# Patient Record
Sex: Female | Born: 1980 | Race: White | Hispanic: No | Marital: Single | Smoking: Former smoker
Health system: Southern US, Community
[De-identification: ages and names within clinical notes are randomized; demographics above are authoritative.]

## PROBLEM LIST (undated history)

## (undated) DIAGNOSIS — G44229 Chronic tension-type headache, not intractable: Secondary | ICD-10-CM

## (undated) DIAGNOSIS — T7840XA Allergy, unspecified, initial encounter: Secondary | ICD-10-CM

## (undated) DIAGNOSIS — F419 Anxiety disorder, unspecified: Secondary | ICD-10-CM

## (undated) DIAGNOSIS — Z8659 Personal history of other mental and behavioral disorders: Secondary | ICD-10-CM

## (undated) DIAGNOSIS — F32A Depression, unspecified: Secondary | ICD-10-CM

## (undated) DIAGNOSIS — F329 Major depressive disorder, single episode, unspecified: Secondary | ICD-10-CM

## (undated) DIAGNOSIS — Q7030 Webbed toes, unspecified foot: Secondary | ICD-10-CM

## (undated) DIAGNOSIS — K219 Gastro-esophageal reflux disease without esophagitis: Secondary | ICD-10-CM

## (undated) HISTORY — DX: Depression, unspecified: F32.A

## (undated) HISTORY — DX: Allergy, unspecified, initial encounter: T78.40XA

## (undated) HISTORY — DX: Webbed toes, unspecified foot: Q70.30

## (undated) HISTORY — DX: Chronic tension-type headache, not intractable: G44.229

## (undated) HISTORY — DX: Anxiety disorder, unspecified: F41.9

## (undated) HISTORY — DX: Gastro-esophageal reflux disease without esophagitis: K21.9

## (undated) HISTORY — DX: Personal history of other mental and behavioral disorders: Z86.59

## (undated) HISTORY — PX: OTHER SURGICAL HISTORY: SHX169

## (undated) HISTORY — DX: Major depressive disorder, single episode, unspecified: F32.9

## (undated) HISTORY — PX: COSMETIC SURGERY: SHX468

---

## 2002-08-01 ENCOUNTER — Encounter: Payer: Self-pay | Admitting: General Surgery

## 2002-08-01 ENCOUNTER — Emergency Department (HOSPITAL_COMMUNITY): Admission: EM | Admit: 2002-08-01 | Discharge: 2002-08-02 | Payer: Self-pay | Admitting: Emergency Medicine

## 2011-12-10 ENCOUNTER — Ambulatory Visit (INDEPENDENT_AMBULATORY_CARE_PROVIDER_SITE_OTHER): Payer: Managed Care, Other (non HMO)

## 2011-12-10 DIAGNOSIS — J0110 Acute frontal sinusitis, unspecified: Secondary | ICD-10-CM

## 2011-12-10 DIAGNOSIS — H66009 Acute suppurative otitis media without spontaneous rupture of ear drum, unspecified ear: Secondary | ICD-10-CM

## 2012-01-03 ENCOUNTER — Ambulatory Visit (INDEPENDENT_AMBULATORY_CARE_PROVIDER_SITE_OTHER): Payer: Managed Care, Other (non HMO) | Admitting: Family Medicine

## 2012-01-03 VITALS — BP 124/80 | HR 83 | Temp 98.7°F | Resp 16 | Ht 66.25 in | Wt 203.6 lb

## 2012-01-03 DIAGNOSIS — J069 Acute upper respiratory infection, unspecified: Secondary | ICD-10-CM

## 2012-01-03 DIAGNOSIS — J029 Acute pharyngitis, unspecified: Secondary | ICD-10-CM

## 2012-01-03 DIAGNOSIS — R059 Cough, unspecified: Secondary | ICD-10-CM

## 2012-01-03 DIAGNOSIS — R05 Cough: Secondary | ICD-10-CM

## 2012-01-03 DIAGNOSIS — B9789 Other viral agents as the cause of diseases classified elsewhere: Secondary | ICD-10-CM

## 2012-01-03 LAB — POCT RAPID STREP A (OFFICE): Rapid Strep A Screen: NEGATIVE

## 2012-01-03 MED ORDER — BENZONATATE 100 MG PO CAPS: 100.0000 mg | ORAL_CAPSULE | Freq: Three times a day (TID) | ORAL | Status: AC | PRN

## 2012-01-03 NOTE — Progress Notes (Signed)
  Patient Name: Veronica Esparza Date of Birth: Jun 16, 1981 Medical Record Number: [id number] Gender: female Date of Encounter: 01/03/2012  History of Present Illness:  Veronica Esparza is a 31 y.o. very pleasant female patient who presents with the following:  She has noticed ST, cough, HA, nausea but no vomiting, slight clear rhinorrhea.  Has fun out of her zyrtec.  Symptoms started with cough 4 days ago, everything else followed.  Has noted chills and subjective fever, also body aches.  Has been laying in bed resting a lot.  Slight diarrhea which she thinks may have been used to what she ate.  Appetite is decreased, but she feel like eating greasy foods.  LMP = 2 weeks ago  There is no problem list on file for this patient.  Past Medical History  Diagnosis Date  . GERD (gastroesophageal reflux disease)   . Allergy   . Anxiety   . Depression   . Chronic tension headaches   . History of anorexia nervosa   . Webbing of toes    Past Surgical History  Procedure Date  . Oral surgery   . Hand webbing excision    History  Substance Use Topics  . Smoking status: Former Games developer  . Smokeless tobacco: Never Used  . Alcohol Use: Yes     socially   Family History  Problem Relation Age of Onset  . Cancer Father   . Hypertension Father    Allergies  Allergen Reactions  . Buspar (Buspirone Hcl)   . Codeine   . Sulfa Antibiotics     Medication list has been reviewed and updated.  Review of Systems: As per HPI- otherwise negative.   Physical Examination: Filed Vitals:   01/03/12 1352  BP: 124/80  Pulse: 83  Temp: 98.7 F (37.1 C)  TempSrc: Oral  Resp: 16  Height: 5' 6.25" (1.683 m)  Weight: 203 lb 9.6 oz (92.352 kg)    Body mass index is 32.61 kg/(m^2).  GEN: WDWN, NAD, Non-toxic, A & O x 3, overweight HEENT: Atraumatic, Normocephalic. Neck supple. No masses, No LAD. Ears and Nose: No external deformity. TM and oropharynx wnl CV: RRR, No M/G/R. No JVD. No thrill. No extra  heart sounds. PULM: CTA B, no wheezes, crackles, rhonchi. No retractions. No resp. distress. No accessory muscle use. EXTR: No c/c/e NEURO Normal gait.  PSYCH: Normally interactive. Conversant. Not depressed or anxious appearing.  Calm demeanor.   Results for orders placed in visit on 01/03/12  POCT RAPID STREP A (OFFICE)      Component Value Range   Rapid Strep A Screen Negative  Negative     Assessment and Plan: 1. Sore throat  POCT rapid strep A  2. Cough  benzonatate (TESSALON) 100 MG capsule  3. Viral URI     Likely viral infection.  Gave Rx for DMM also.  Gave zpack rx to hold- emphasized with her that this is likely a viral infection, but if she is not better within 2 or 3 days she can start the zpack if desired.  Let us know if she is not better within several days- Sooner if worse.

## 2012-01-15 ENCOUNTER — Ambulatory Visit: Payer: Managed Care, Other (non HMO)

## 2012-01-15 ENCOUNTER — Ambulatory Visit (INDEPENDENT_AMBULATORY_CARE_PROVIDER_SITE_OTHER): Payer: Managed Care, Other (non HMO) | Admitting: Family Medicine

## 2012-01-15 DIAGNOSIS — R062 Wheezing: Secondary | ICD-10-CM

## 2012-01-15 DIAGNOSIS — R059 Cough, unspecified: Secondary | ICD-10-CM

## 2012-01-15 DIAGNOSIS — K219 Gastro-esophageal reflux disease without esophagitis: Secondary | ICD-10-CM

## 2012-01-15 DIAGNOSIS — R05 Cough: Secondary | ICD-10-CM

## 2012-01-15 DIAGNOSIS — F418 Other specified anxiety disorders: Secondary | ICD-10-CM | POA: Insufficient documentation

## 2012-01-15 MED ORDER — PREDNISONE 20 MG PO TABS: ORAL_TABLET | ORAL | Status: DC

## 2012-01-15 MED ORDER — ALBUTEROL SULFATE HFA 108 (90 BASE) MCG/ACT IN AERS: 2.0000 | INHALATION_SPRAY | Freq: Four times a day (QID) | RESPIRATORY_TRACT | Status: DC | PRN

## 2012-01-15 NOTE — Patient Instructions (Signed)
Can increase tessalon to 3 times per day.  Proair up to every 6 hours as needed.  Drink plenty of fluids.  If not improved in 2 days, can fill prednisone. Return to the clinic or go to the nearest emergency room if any of your symptoms worsen or new symptoms occur.    Cough, Adult  A cough is a reflex that helps clear your throat and airways. It can help heal the body or may be a reaction to an irritated airway. A cough may only last 2 or 3 weeks (acute) or may last more than 8 weeks (chronic).  CAUSES Acute cough:  Viral or bacterial infections.  Chronic cough:  Infections.   Allergies.   Asthma.   Post-nasal drip.   Smoking.   Heartburn or acid reflux.   Some medicines.   Chronic lung problems (COPD).   Cancer.  SYMPTOMS   Cough.   Fever.   Chest pain.   Increased breathing rate.   High-pitched whistling sound when breathing (wheezing).   Colored mucus that you cough up (sputum).  TREATMENT   A bacterial cough may be treated with antibiotic medicine.   A viral cough must run its course and will not respond to antibiotics.   Your caregiver may recommend other treatments if you have a chronic cough.  HOME CARE INSTRUCTIONS   Only take over-the-counter or prescription medicines for pain, discomfort, or fever as directed by your caregiver. Use cough suppressants only as directed by your caregiver.   Use a cold steam vaporizer or humidifier in your bedroom or home to help loosen secretions.   Sleep in a semi-upright position if your cough is worse at night.   Rest as needed.   Stop smoking if you smoke.  SEEK IMMEDIATE MEDICAL CARE IF:   You have pus in your sputum.   Your cough starts to worsen.   You cannot control your cough with suppressants and are losing sleep.   You begin coughing up blood.   You have difficulty breathing.   You develop pain which is getting worse or is uncontrolled with medicine.   You have a fever.  MAKE SURE YOU:    Understand these instructions.   Will watch your condition.   Will get help right away if you are not doing well or get worse.  Document Released: 04/27/2011 Document Revised: 10/18/2011 Document Reviewed: 04/27/2011 [facility] Patient Information 2012 [place], [place].

## 2012-01-15 NOTE — Progress Notes (Signed)
  Subjective:    Patient ID: Veronica Esparza, female    DOB: 1980/11/28, 31 y.o.   MRN: [id number]  HPI  Veronica Esparza is a 31 y.o. female Seen 01/03/12 for probable viral uri  With sx''s 3-4 days.  Took Zpak  Few days later - more sinus pressure, sore throat, and runny nose.- last dose 01/10/12.   Still with cough - feels worse, initially dry cough - now hacking cough, but still dry.  No fever. More wheezing past 3-4 days.  Feels like chest congested.  Tx: proair -taking 2 puffs every 6 hours - past 3 days.hx gerd, but no missed doses PPI, no heartburn.   Review of Systems  Constitutional: Negative for fever and chills.  HENT: Negative for ear pain, congestion, sore throat and rhinorrhea.   Respiratory: Positive for cough, chest tightness, shortness of breath and wheezing.   Skin: Negative for rash.       Objective:   Physical Exam  Constitutional: She is oriented to person, place, and time. She appears well-developed and well-nourished. No distress.  HENT:  Head: Normocephalic and atraumatic.  Right Ear: External ear normal.  Left Ear: External ear normal.  Mouth/Throat: Oropharynx is clear and moist. No oropharyngeal exudate.  Eyes: Conjunctivae and EOM are normal. Pupils are equal, round, and reactive to light.  Cardiovascular: Normal rate, regular rhythm and normal heart sounds.   No murmur heard. Pulmonary/Chest: Effort normal and breath sounds normal. No respiratory distress. She has no wheezes.  Lymphadenopathy:    She has no cervical adenopathy.  Neurological: She is alert and oriented to person, place, and time.  Skin: Skin is warm and dry.  Psychiatric: She has a normal mood and affect. Her behavior is normal.   [facility] reading (PRIMARY) by  Dr. Neva Seat: CXR - NAD.        Assessment & Plan:  Veronica Esparza is a 31 y.o. female 1. GERD (gastroesophageal reflux disease)    2. Cough  DG Chest 2 View  3. Wheezing  DG Chest 2 View   Possible post viral cough vs component of  bronchospasm, and LPR with hx GERD.  S.p Zpack, unlikely infectious etiology at present.Reassuring exam and peak flow.   Cont proair hfa q4-6hprn incr tessalon to tid prn If not improving in next 2 days, can fill prednisone 20mg  2 qd x 5d.  Rtc/er  if any worsening.

## 2012-01-15 NOTE — Progress Notes (Signed)
  Subjective:    Patient ID: Veronica Esparza, female    DOB: 03-19-1981, 31 y.o.   MRN: [id number]  HPI    Review of Systems     Objective:   Physical Exam      CXR reviewed by L. Jozeph Persing MD to enable xray to be sent to radiology.  [facility] Primary radiology reading by Dr. Georgiana Shore: NAD Assessment & Plan:

## 2012-04-03 ENCOUNTER — Other Ambulatory Visit: Payer: Self-pay | Admitting: Physician Assistant

## 2012-04-03 MED ORDER — FLUTICASONE PROPIONATE 50 MCG/ACT NA SUSP: 2.0000 | Freq: Every day | NASAL | Status: DC

## 2012-05-18 IMAGING — CR DG CHEST 2V
2 series · 2 of 2 positions shown · non-contrast
Comparison: None.

CLINICAL DATA: Cough, wheezing

CHEST - 2 VIEW

[lateral]
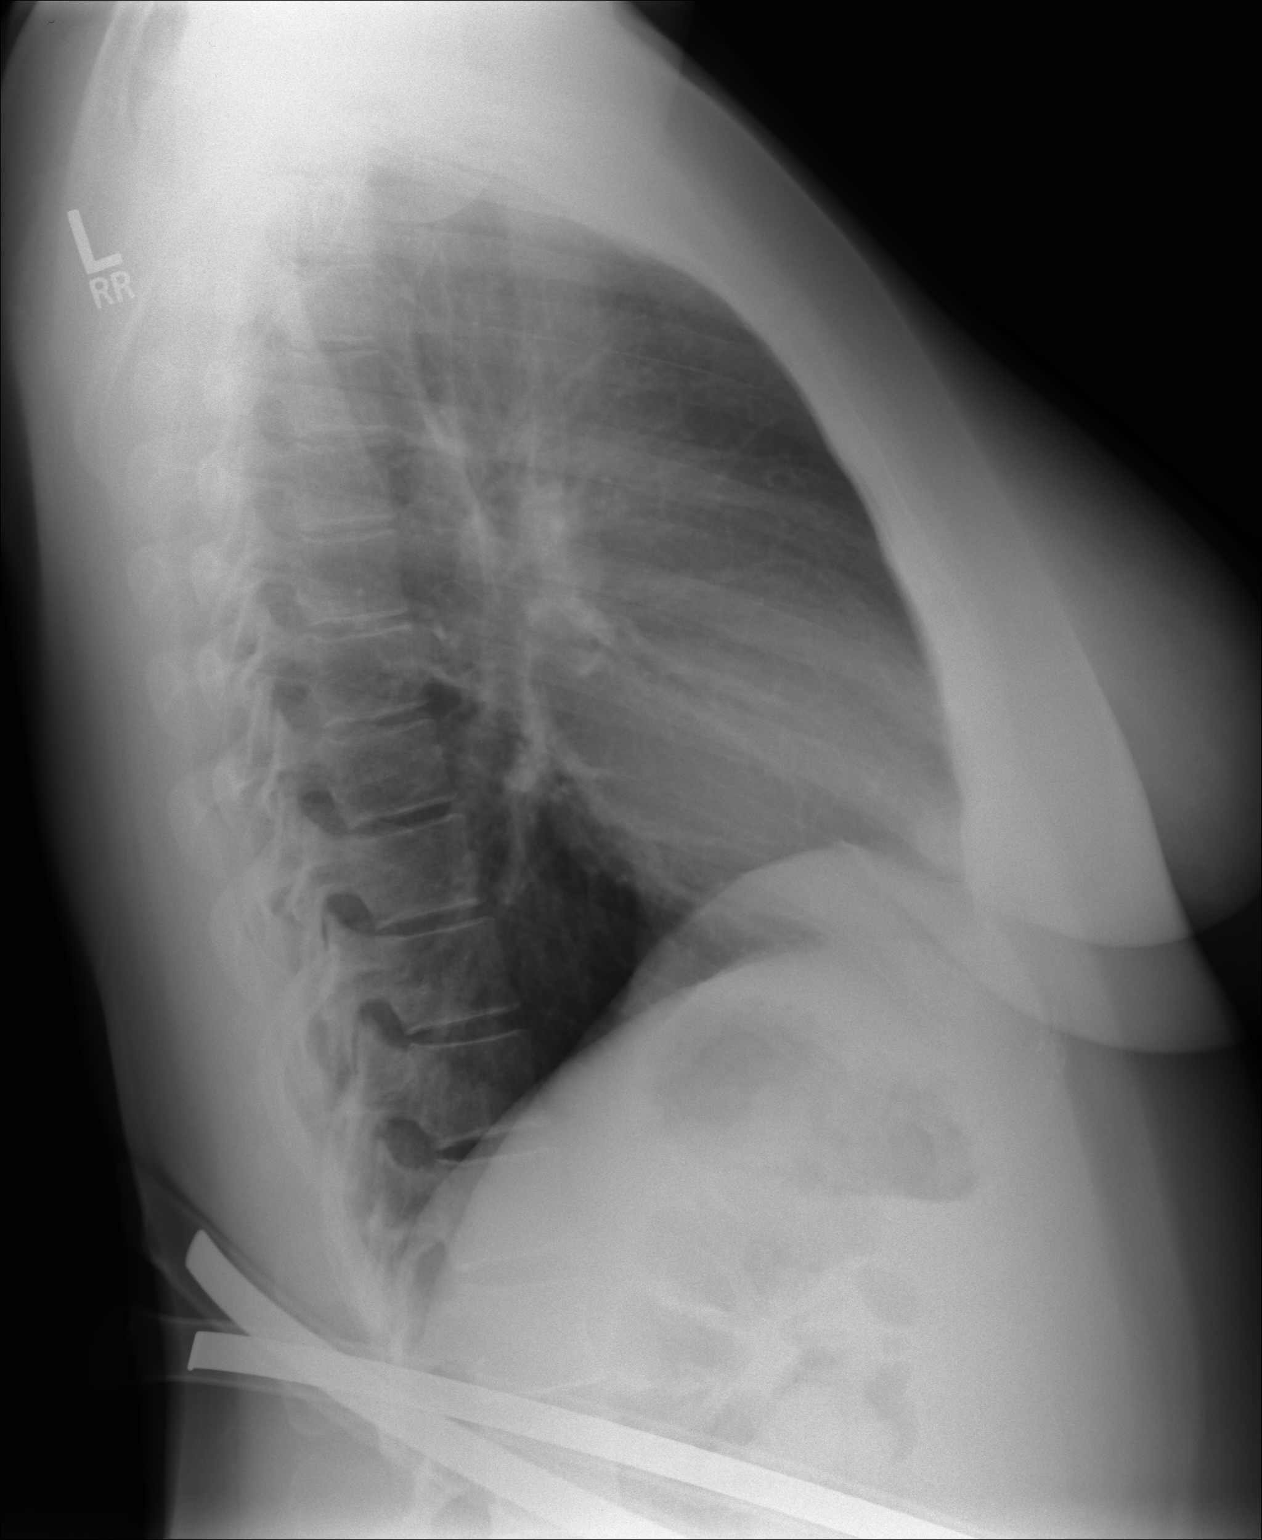

[PA]
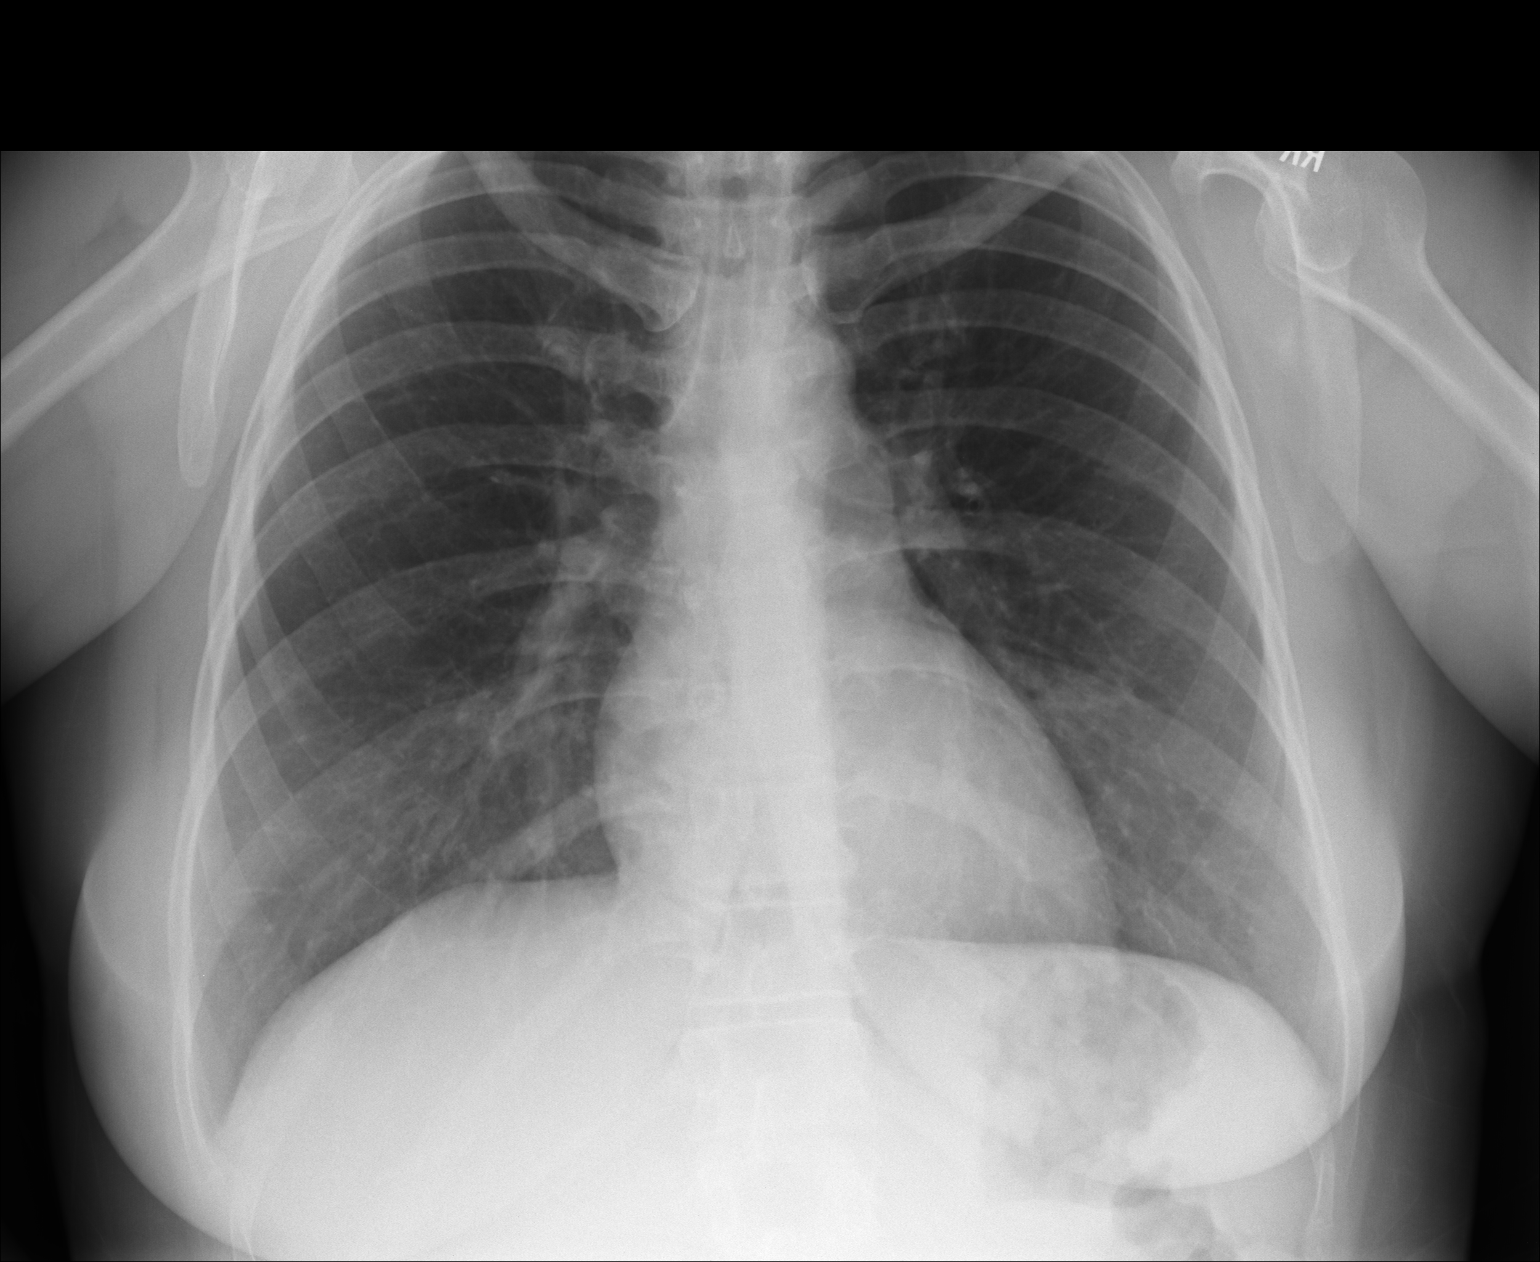

[2 of 2 positions shown; findings below may reference images not displayed]

FINDINGS: The heart size and mediastinal contours are within
normal limits.  Both lungs are clear.  The visualized skeletal
structures are unremarkable.
IMPRESSION: No active cardiopulmonary disease.

## 2012-06-16 ENCOUNTER — Ambulatory Visit (INDEPENDENT_AMBULATORY_CARE_PROVIDER_SITE_OTHER): Payer: Managed Care, Other (non HMO) | Admitting: Emergency Medicine

## 2012-06-16 VITALS — BP 110/62 | HR 88 | Temp 99.7°F | Resp 18 | Ht 66.0 in | Wt 215.0 lb

## 2012-06-16 DIAGNOSIS — J329 Chronic sinusitis, unspecified: Secondary | ICD-10-CM

## 2012-06-16 DIAGNOSIS — R0981 Nasal congestion: Secondary | ICD-10-CM

## 2012-06-16 DIAGNOSIS — R05 Cough: Secondary | ICD-10-CM

## 2012-06-16 DIAGNOSIS — J45909 Unspecified asthma, uncomplicated: Secondary | ICD-10-CM

## 2012-06-16 DIAGNOSIS — J3489 Other specified disorders of nose and nasal sinuses: Secondary | ICD-10-CM

## 2012-06-16 MED ORDER — ALBUTEROL SULFATE (2.5 MG/3ML) 0.083% IN NEBU: 2.5000 mg | INHALATION_SOLUTION | RESPIRATORY_TRACT | Status: AC

## 2012-06-16 MED ORDER — AMOXICILLIN-POT CLAVULANATE 875-125 MG PO TABS: 1.0000 | ORAL_TABLET | Freq: Two times a day (BID) | ORAL | Status: AC

## 2012-06-16 MED ORDER — PREDNISONE 20 MG PO TABS: ORAL_TABLET | ORAL | Status: DC

## 2012-06-16 NOTE — Progress Notes (Signed)
  Subjective:    Patient ID: Veronica Esparza, female    DOB: Oct 23, 1981, 31 y.o.   MRN: [id number]  HPI  Appox 2 weeks ago patient began to have difficulty with her allergies. She is on Zyrtec and Flonase. She started to feel better then develop sore throat associated with fever. She also developed cough which is associated with wheezing and is using her inhaler. She coughs up a minimal amount of colored phlegm.  Review of Systems     Objective:   Physical Exam  Constitutional: She appears well-developed and well-nourished.  HENT:  Right Ear: External ear normal.  Left Ear: External ear normal.       Throat is red.  Eyes: Pupils are equal, round, and reactive to light.  Neck: Normal range of motion. Neck supple.  Pulmonary/Chest:       Rhonchi in both bases   Results for orders placed in visit on 06/16/12  POCT RAPID STREP A (OFFICE)      Component Value Range   Rapid Strep A Screen Negative  Negative         Assessment & Plan:  Augmentin ,prednisone taper.BC backup

## 2012-06-17 ENCOUNTER — Telehealth: Payer: Self-pay

## 2012-06-17 NOTE — Telephone Encounter (Signed)
She will be just fine if she take it as it is written on the bottle. 2 po daily for 5 days.

## 2012-06-17 NOTE — Telephone Encounter (Signed)
Spoke with pt advised message from Ryan. Pt understood. 

## 2012-06-17 NOTE — Telephone Encounter (Signed)
Pt states Dr. Cleta Alberts told her to taper down from the predniSONE (DELTASONE) 20 MG tablet, taking 2/day initially then 1/day but rx says 2/day for 5 days. She is wondering which to follow.  Best [phone]

## 2012-06-18 ENCOUNTER — Telehealth: Payer: Self-pay

## 2012-06-18 ENCOUNTER — Ambulatory Visit: Payer: Managed Care, Other (non HMO) | Admitting: Family Medicine

## 2012-06-18 NOTE — Telephone Encounter (Signed)
Gave pt instr's and she agreed. She stated her Sxs are starting to improve.

## 2012-06-18 NOTE — Telephone Encounter (Signed)
Pt reports that after she takes the pred, her heart races (noticed the first day and laid down to slow it down, then yesterday it was much worse. She has not taken it today). States that the HR returns to normal after awhile but she remains jittery and can not sleep even if she takes the pred early in the day. Pt would like to stop the pred and take something else if possible. Pt is on an Abx, should she just DC the pred (she is taking 2 tab QD for 5 days).

## 2012-06-18 NOTE — Telephone Encounter (Signed)
PT STATES THE PREDNISONE SHE WAS PUT ON IS MAKING HER HEART RACE, DIDN'T KNOW IF WE NEED TO CHANGE THE DOSAGE OR THE MEDICINE PLEASE CALL [phone]

## 2012-06-18 NOTE — Telephone Encounter (Signed)
OK to D/C the prednisone, but continue the Augmentin until it's gone.  If her symptoms persist, RTC.

## 2012-06-23 ENCOUNTER — Telehealth: Payer: Self-pay

## 2012-06-23 MED ORDER — FLUCONAZOLE 150 MG PO TABS: 150.0000 mg | ORAL_TABLET | Freq: Once | ORAL | Status: AC

## 2012-06-23 NOTE — Telephone Encounter (Signed)
Pt taking antibiotics and developed a yeast infection.  Pt would like a rx for diflucan called in to her pharmacy.

## 2012-06-23 NOTE — Telephone Encounter (Signed)
Sent in for her okay per Herbert Seta, [place] called patient to advise.

## 2012-08-01 ENCOUNTER — Ambulatory Visit (INDEPENDENT_AMBULATORY_CARE_PROVIDER_SITE_OTHER): Payer: Managed Care, Other (non HMO) | Admitting: Physician Assistant

## 2012-08-01 VITALS — BP 128/76 | HR 85 | Temp 98.0°F | Resp 16 | Ht 66.5 in | Wt 210.0 lb

## 2012-08-01 DIAGNOSIS — J029 Acute pharyngitis, unspecified: Secondary | ICD-10-CM

## 2012-08-01 DIAGNOSIS — R059 Cough, unspecified: Secondary | ICD-10-CM

## 2012-08-01 DIAGNOSIS — R05 Cough: Secondary | ICD-10-CM

## 2012-08-01 LAB — POCT RAPID STREP A (OFFICE): Rapid Strep A Screen: NEGATIVE

## 2012-08-01 MED ORDER — AMOXICILLIN 875 MG PO TABS: 875.0000 mg | ORAL_TABLET | Freq: Two times a day (BID) | ORAL | Status: AC

## 2012-08-01 NOTE — Progress Notes (Signed)
  Subjective:    Patient ID: Veronica Esparza, female    DOB: 08-16-1981, 31 y.o.   MRN: [id number]  HPI 31 year old female presents with 5 day history of sore throat, fever, chills, and body aches. States she had a fever at the beginning of the week that was over 100 but she has been afebrile x 2 days now.  She was taking tylenol q4hours for fever.  Does complain of a slight cough but she has been not been taking her Zyrtec due to her throat pain.  Re-started that yesterday which has helped some. Complains of ear fullness and pressure. No sinus pain, nasal congestion, abdominal pain, nausea, or vomiting.     Review of Systems  All other systems reviewed and are negative.       Objective:   Physical Exam  Constitutional: She is oriented to person, place, and time. She appears well-developed and well-nourished.  HENT:  Head: Normocephalic and atraumatic.  Right Ear: Hearing, tympanic membrane, external ear and ear canal normal.  Left Ear: Hearing, tympanic membrane, external ear and ear canal normal.  Mouth/Throat: Uvula is midline, oropharynx is clear and moist and mucous membranes are normal. No oropharyngeal exudate (bilateral tonsillar erythema).  Neck: Normal range of motion.  Cardiovascular: Normal rate, regular rhythm and normal heart sounds.   Pulmonary/Chest: Effort normal and breath sounds normal.  Lymphadenopathy:    She has cervical adenopathy.  Neurological: She is alert and oriented to person, place, and time.  Psychiatric: She has a normal mood and affect. Her behavior is normal. Judgment and thought content normal.          Assessment & Plan:   1. Acute pharyngitis  POCT rapid strep A, amoxicillin (AMOXIL) 875 MG tablet  2. Cough    Will cover strep with amoxicillin 875 mg bid Continue tylenol as needed for pain. Recommend alternating with ibuprofen and spacing out tylenol doses.  Continue flonase and zyrtec.

## 2012-09-17 ENCOUNTER — Ambulatory Visit (INDEPENDENT_AMBULATORY_CARE_PROVIDER_SITE_OTHER): Payer: Managed Care, Other (non HMO) | Admitting: Emergency Medicine

## 2012-09-17 VITALS — BP 114/75 | HR 93 | Temp 99.2°F | Resp 18 | Ht 66.0 in | Wt 209.8 lb

## 2012-09-17 DIAGNOSIS — J020 Streptococcal pharyngitis: Secondary | ICD-10-CM

## 2012-09-17 MED ORDER — AMOXICILLIN 875 MG PO TABS: 875.0000 mg | ORAL_TABLET | Freq: Two times a day (BID) | ORAL | Status: DC

## 2012-09-17 NOTE — Progress Notes (Signed)
[facility] [address], [place] [address] [phone]  Date:  09/17/2012   Name:  Neira Bentsen   DOB:  01/23/1981   MRN:  [id number]  PCP:  Dois Davenport., MD    Chief Complaint: Sore Throat, Fever, Nausea and Headache   History of Present Illness:  Ayisha Pol is a 31 y.o. very pleasant female patient who presents with the following:  Ill since last night with marked sore throat and pain with swallowing.  Has a fever.  No chills or cough.  No runny nose or post nasal drip  No improvement with OTC medication.  Is a kindergarten teacher  Patient Active Problem List  Diagnosis  . GERD (gastroesophageal reflux disease)  . Depression with anxiety    Past Medical History  Diagnosis Date  . GERD (gastroesophageal reflux disease)   . Allergy   . Anxiety   . Depression   . Chronic tension headaches   . History of anorexia nervosa   . Webbing of toes     Past Surgical History  Procedure Date  . Oral surgery   . Hand webbing excision   . Cosmetic surgery     History  Substance Use Topics  . Smoking status: Former Games developer  . Smokeless tobacco: Never Used  . Alcohol Use: Yes     Comment: socially    Family History  Problem Relation Age of Onset  . Cancer Father   . Hypertension Father   . Stroke Father   . Diabetes Mother   . Hypothyroidism Mother   . Depression Brother   . Anxiety disorder Brother   . COPD Maternal Grandmother   . Heart disease Maternal Grandfather   . Diabetes Paternal Grandmother   . Cancer Paternal Grandfather     Allergies  Allergen Reactions  . Buspar (Buspirone Hcl)   . Codeine   . Sulfa Antibiotics     Medication list has been reviewed and updated.  Current Outpatient Prescriptions on File Prior to Visit  Medication Sig Dispense Refill  . albuterol (PROVENTIL HFA;VENTOLIN HFA) 108 (90 BASE) MCG/ACT inhaler Inhale 2 puffs into the lungs every 6 (six) hours as needed.  1 Inhaler  0  . ALPRAZolam  (XANAX) 0.25 MG tablet Take 0.25 mg by mouth at bedtime as needed.      . cetirizine (ZYRTEC) 10 MG tablet Take 10 mg by mouth daily.      Marland Kitchen FLUoxetine (PROZAC) 20 MG capsule Take 20 mg by mouth daily.      . fluticasone (FLONASE) 50 MCG/ACT nasal spray Place 2 sprays into the nose daily.  16 g  3  . Norethindrone-Ethinyl Estradiol-Fe (GENERESS FE) 0.8-25 MG-MCG tablet Chew 1 tablet by mouth daily.      Marland Kitchen omeprazole (PRILOSEC OTC) 20 MG tablet Take 20 mg by mouth daily.      . benzonatate (TESSALON) 100 MG capsule Take 100 mg by mouth 3 (three) times daily as needed.      . drospirenone-ethinyl estradiol (YASMIN,ZARAH,SYEDA) 3-0.03 MG tablet Take 1 tablet by mouth daily.      . predniSONE (DELTASONE) 20 MG tablet Take 2 by mouth for 5 days.  10 tablet  0    Review of Systems:  GEN: WDWN, NAD, Non-toxic, A & O x 3 HEENT: Atraumatic, Normocephalic. Neck supple. No masses, No LAD.  No rash or sepsis.  Oropharynx red and injected.  No exudate.  Marked tender anterior cervical nodes Ears and Nose: No  external deformity.  TM dull red CV: RRR, No M/G/R. No JVD. No thrill. No extra heart sounds.   PULM: CTA B, no wheezes, crackles, rhonchi. No retractions. No resp. distress. No accessory muscle use. ABD: S, NT, ND, +BS. No rebound. No HSM. EXTR: No c/c/e NEURO Normal gait.  PSYCH: Normally interactive. Conversant. Not depressed or anxious appearing.  Calm demeanor.    Physical Examination: Filed Vitals:   09/17/12 1653  BP: 114/75  Pulse: 93  Temp: 99.2 F (37.3 C)  Resp: 18   Filed Vitals:   09/17/12 1653  Height: 5\' 6"  (1.676 m)  Weight: 209 lb 12.8 oz (95.165 kg)   Body mass index is 33.86 kg/(m^2). Ideal Body Weight: Weight in (lb) to have BMI = 25: 154.6   Strep throat  Assessment and Plan: Strep throat amoxicillin  Carmelina Dane, MD

## 2012-09-18 ENCOUNTER — Telehealth: Payer: Self-pay

## 2012-09-18 NOTE — Telephone Encounter (Signed)
Pt called, still not feeling well, would like to know if she can get a note excuse her from work tomorrow.   [phone]

## 2012-09-19 NOTE — Telephone Encounter (Signed)
Note done and ready for pick up.

## 2012-09-19 NOTE — Telephone Encounter (Signed)
LMOM that [employer] letter is ready for her to p/up

## 2012-12-12 ENCOUNTER — Ambulatory Visit (INDEPENDENT_AMBULATORY_CARE_PROVIDER_SITE_OTHER): Payer: BC Managed Care – PPO | Admitting: Emergency Medicine

## 2012-12-12 VITALS — BP 125/84 | HR 83 | Temp 98.4°F | Resp 18 | Ht 66.0 in | Wt 213.0 lb

## 2012-12-12 DIAGNOSIS — J018 Other acute sinusitis: Secondary | ICD-10-CM

## 2012-12-12 DIAGNOSIS — IMO0001 Reserved for inherently not codable concepts without codable children: Secondary | ICD-10-CM

## 2012-12-12 MED ORDER — PSEUDOEPHEDRINE-GUAIFENESIN ER 60-600 MG PO TB12: 1.0000 | ORAL_TABLET | Freq: Two times a day (BID) | ORAL | Status: DC

## 2012-12-12 MED ORDER — ALBUTEROL SULFATE HFA 108 (90 BASE) MCG/ACT IN AERS: 2.0000 | INHALATION_SPRAY | RESPIRATORY_TRACT | Status: DC | PRN

## 2012-12-12 NOTE — Patient Instructions (Addendum)

## 2012-12-12 NOTE — Progress Notes (Signed)
[facility] [address] [place] [address] [phone]  Date:  12/12/2012   Name:  Veronica Esparza   DOB:  02-Jan-1981   MRN:  [id number]  PCP:  Dois Davenport., MD    Chief Complaint: Cough and Nasal Congestion   History of Present Illness:  Veronica Esparza is a 32 y.o. very pleasant female patient who presents with the following:  Ill for several days with nasal congestion and discharge with a mucoid post nasal drainage.  Cough productive mucoid sputum.  No wheezing or shortness of breath.  No nausea or vomiting.  Multiple sick contacts.  No improvement with OTC medications  No rash.   Patient Active Problem List  Diagnosis  . GERD (gastroesophageal reflux disease)  . Depression with anxiety    Past Medical History  Diagnosis Date  . GERD (gastroesophageal reflux disease)   . Allergy   . Anxiety   . Depression   . Chronic tension headaches   . History of anorexia nervosa   . Webbing of toes     Past Surgical History  Procedure Date  . Oral surgery   . Hand webbing excision   . Cosmetic surgery     History  Substance Use Topics  . Smoking status: Former Games developer  . Smokeless tobacco: Never Used  . Alcohol Use: Yes     Comment: socially    Family History  Problem Relation Age of Onset  . Cancer Father   . Hypertension Father   . Stroke Father   . Diabetes Mother   . Hypothyroidism Mother   . Depression Brother   . Anxiety disorder Brother   . COPD Maternal Grandmother   . Heart disease Maternal Grandfather   . Diabetes Paternal Grandmother   . Cancer Paternal Grandfather     Allergies  Allergen Reactions  . Buspar (Buspirone Hcl)   . Codeine   . Sulfa Antibiotics     Medication list has been reviewed and updated.  Current Outpatient Prescriptions on File Prior to Visit  Medication Sig Dispense Refill  . ALPRAZolam (XANAX) 0.25 MG tablet Take 0.25 mg by mouth at bedtime as needed.      . cetirizine (ZYRTEC) 10 MG tablet  Take 10 mg by mouth daily.      Marland Kitchen FLUoxetine (PROZAC) 20 MG capsule Take 20 mg by mouth daily.      . fluticasone (FLONASE) 50 MCG/ACT nasal spray Place 2 sprays into the nose daily.  16 g  3  . Norethindrone-Ethinyl Estradiol-Fe (GENERESS FE) 0.8-25 MG-MCG tablet Chew 1 tablet by mouth daily.      Marland Kitchen omeprazole (PRILOSEC OTC) 20 MG tablet Take 20 mg by mouth daily.      Marland Kitchen albuterol (PROVENTIL HFA;VENTOLIN HFA) 108 (90 BASE) MCG/ACT inhaler Inhale 2 puffs into the lungs every 6 (six) hours as needed.  1 Inhaler  0  . amoxicillin (AMOXIL) 875 MG tablet Take 1 tablet (875 mg total) by mouth 2 (two) times daily.  20 tablet  0  . benzonatate (TESSALON) 100 MG capsule Take 100 mg by mouth 3 (three) times daily as needed.      . drospirenone-ethinyl estradiol (YASMIN,ZARAH,SYEDA) 3-0.03 MG tablet Take 1 tablet by mouth daily.      . predniSONE (DELTASONE) 20 MG tablet Take 2 by mouth for 5 days.  10 tablet  0    Review of Systems:  As per HPI, otherwise negative.    Physical Examination: Filed Vitals:  12/12/12 1745  BP: 125/84  Pulse: 83  Temp: 98.4 F (36.9 C)  Resp: 18   Filed Vitals:   12/12/12 1745  Height: 5\' 6"  (1.676 m)  Weight: 213 lb (96.616 kg)   Body mass index is 34.38 kg/(m^2). Ideal Body Weight: Weight in (lb) to have BMI = 25: 154.6   GEN: WDWN, NAD, Non-toxic, A & O x 3 HEENT: Atraumatic, Normocephalic. Neck supple. No masses, No LAD. Ears and Nose: No external deformity. CV: RRR, No M/G/R. No JVD. No thrill. No extra heart sounds. PULM: CTA B, no wheezes, crackles, rhonchi. No retractions. No resp. distress. No accessory muscle use. ABD: S, NT, ND, +BS. No rebound. No HSM. EXTR: No c/c/e NEURO Normal gait.  PSYCH: Normally interactive. Conversant. Not depressed or anxious appearing.  Calm demeanor.    Assessment and Plan: Cold mucinex d Robitussin DM  Carmelina Dane, MD

## 2013-01-23 ENCOUNTER — Other Ambulatory Visit: Payer: Self-pay | Admitting: Physician Assistant

## 2013-02-06 ENCOUNTER — Ambulatory Visit (INDEPENDENT_AMBULATORY_CARE_PROVIDER_SITE_OTHER): Payer: BC Managed Care – PPO | Admitting: Emergency Medicine

## 2013-02-06 VITALS — BP 132/88 | HR 104 | Temp 98.6°F | Resp 16 | Ht 66.75 in | Wt 209.0 lb

## 2013-02-06 DIAGNOSIS — J209 Acute bronchitis, unspecified: Secondary | ICD-10-CM

## 2013-02-06 DIAGNOSIS — J018 Other acute sinusitis: Secondary | ICD-10-CM

## 2013-02-06 MED ORDER — AMOXICILLIN-POT CLAVULANATE 875-125 MG PO TABS: 1.0000 | ORAL_TABLET | Freq: Two times a day (BID) | ORAL | Status: DC

## 2013-02-06 MED ORDER — PSEUDOEPHEDRINE-GUAIFENESIN ER 60-600 MG PO TB12: 1.0000 | ORAL_TABLET | Freq: Two times a day (BID) | ORAL | Status: DC

## 2013-02-06 NOTE — Progress Notes (Signed)
[facility] [address] [place] [address] [phone]  Date:  02/06/2013   Name:  Nychelle Cassata   DOB:  [birth year]   MRN:  [id number]  PCP:  Dois Davenport., MD    Chief Complaint: Cough, Fatigue and Sore Throat   History of Present Illness:  Makayla Lanter is a 32 y.o. very pleasant female patient who presents with the following:  Ill all week with purulent nasal drainage and post nasal drip.  Sore throat related to cough.  Cough is associated with wheezing at times.  No history of asthma.  Has a fever and chills.  No nausea and vomiting or stool change.  No rash.  No improvement with over the counter medications or other home remedies. Denies other complaint or health concern today.   Patient Active Problem List  Diagnosis  . GERD (gastroesophageal reflux disease)  . Depression with anxiety    Past Medical History  Diagnosis Date  . GERD (gastroesophageal reflux disease)   . Allergy   . Anxiety   . Depression   . Chronic tension headaches   . History of anorexia nervosa   . Webbing of toes     Past Surgical History  Procedure Laterality Date  . Oral surgery    . Hand webbing excision    . Cosmetic surgery      History  Substance Use Topics  . Smoking status: Former Games developer  . Smokeless tobacco: Never Used  . Alcohol Use: Yes     Comment: socially    Family History  Problem Relation Age of Onset  . Cancer Father   . Hypertension Father   . Stroke Father   . Diabetes Mother   . Hypothyroidism Mother   . Depression Brother   . Anxiety disorder Brother   . COPD Maternal Grandmother   . Heart disease Maternal Grandfather   . Diabetes Paternal Grandmother   . Cancer Paternal Grandfather     Allergies  Allergen Reactions  . Buspar (Buspirone Hcl)   . Codeine   . Sulfa Antibiotics     Medication list has been reviewed and updated.  Current Outpatient Prescriptions on File Prior to Visit  Medication Sig Dispense Refill  .  albuterol (PROVENTIL HFA;VENTOLIN HFA) 108 (90 BASE) MCG/ACT inhaler Inhale 2 puffs into the lungs every 6 (six) hours as needed.  1 Inhaler  0  . ALPRAZolam (XANAX) 0.25 MG tablet Take 0.25 mg by mouth at bedtime as needed.      . cetirizine (ZYRTEC) 10 MG tablet Take 10 mg by mouth daily.      Marland Kitchen FLUoxetine (PROZAC) 20 MG capsule Take 20 mg by mouth daily.      . fluticasone (FLONASE) 50 MCG/ACT nasal spray PLACE 2 SPRAYS INTO THE NOSE DAILY.  16 g  3  . Norethindrone-Ethinyl Estradiol-Fe (GENERESS FE) 0.8-25 MG-MCG tablet Chew 1 tablet by mouth daily.      Marland Kitchen omeprazole (PRILOSEC OTC) 20 MG tablet Take 20 mg by mouth daily.      Marland Kitchen albuterol (PROVENTIL HFA;VENTOLIN HFA) 108 (90 BASE) MCG/ACT inhaler Inhale 2 puffs into the lungs every 4 (four) hours as needed for wheezing (cough, shortness of breath or wheezing.).  1 Inhaler  1  . amoxicillin (AMOXIL) 875 MG tablet Take 1 tablet (875 mg total) by mouth 2 (two) times daily.  20 tablet  0  . benzonatate (TESSALON) 100 MG capsule Take 100 mg by mouth 3 (three) times daily as  needed.      . drospirenone-ethinyl estradiol (YASMIN,ZARAH,SYEDA) 3-0.03 MG tablet Take 1 tablet by mouth daily.      . predniSONE (DELTASONE) 20 MG tablet Take 2 by mouth for 5 days.  10 tablet  0  . pseudoephedrine-guaifenesin (MUCINEX D) 60-600 MG per tablet Take 1 tablet by mouth every 12 (twelve) hours.  18 tablet  0   No current facility-administered medications on file prior to visit.    Review of Systems:  As per HPI, otherwise negative.    Physical Examination: Filed Vitals:   02/06/13 1524  BP: 132/88  Pulse: 104  Temp: 98.6 F (37 C)  Resp: 16   Filed Vitals:   02/06/13 1524  Height: 5' 6.75" (1.695 m)  Weight: 209 lb (94.802 kg)   Body mass index is 33 kg/(m^2). Ideal Body Weight: Weight in (lb) to have BMI = 25: 158.1  GEN: WDWN, NAD, Non-toxic, A & O x 3 HEENT: Atraumatic, Normocephalic. Neck supple. No masses, No LAD. Ears and Nose: No  external deformity. CV: RRR, No M/G/R. No JVD. No thrill. No extra heart sounds. PULM: CTA B, no wheezes, crackles, rhonchi. No retractions. No resp. distress. No accessory muscle use. ABD: S, NT, ND, +BS. No rebound. No HSM. EXTR: No c/c/e NEURO Normal gait.  PSYCH: Normally interactive. Conversant. Not depressed or anxious appearing.  Calm demeanor.    Assessment and Plan: Sinusitis Bronchitis  augmentin mucinex   Signed,  Phillips Odor, MD

## 2013-02-06 NOTE — Patient Instructions (Signed)
Bronchitis  Bronchitis is the body's way of reacting to injury and/or infection (inflammation) of the bronchi. Bronchi are the air tubes that extend from the windpipe into the lungs. If the inflammation becomes severe, it may cause shortness of breath.  CAUSES   Inflammation may be caused by:   A virus.   Germs (bacteria).   Dust.   Allergens.   Pollutants and many other irritants.  The cells lining the bronchial tree are covered with tiny hairs (cilia). These constantly beat upward, away from the lungs, toward the mouth. This keeps the lungs free of pollutants. When these cells become too irritated and are unable to do their job, mucus begins to develop. This causes the characteristic cough of bronchitis. The cough clears the lungs when the cilia are unable to do their job. Without either of these protective mechanisms, the mucus would settle in the lungs. Then you would develop pneumonia.  Smoking is a common cause of bronchitis and can contribute to pneumonia. Stopping this habit is the single most important thing you can do to help yourself.  TREATMENT    Your caregiver may prescribe an antibiotic if the cough is caused by bacteria. Also, medicines that open up your airways make it easier to breathe. Your caregiver may also recommend or prescribe an expectorant. It will loosen the mucus to be coughed up. Only take over-the-counter or prescription medicines for pain, discomfort, or fever as directed by your caregiver.   Removing whatever causes the problem (smoking, for example) is critical to preventing the problem from getting worse.   Cough suppressants may be prescribed for relief of cough symptoms.   Inhaled medicines may be prescribed to help with symptoms now and to help prevent problems from returning.   For those with recurrent (chronic) bronchitis, there may be a need for steroid medicines.  SEEK IMMEDIATE MEDICAL CARE IF:    During treatment, you develop more pus-like mucus (purulent  sputum).   You have a fever.   Your baby is older than 3 months with a rectal temperature of 102 F (38.9 C) or higher.   Your baby is 3 months old or younger with a rectal temperature of 100.4 F (38 C) or higher.   You become progressively more ill.   You have increased difficulty breathing, wheezing, or shortness of breath.  It is necessary to seek immediate medical care if you are elderly or sick from any other disease.  MAKE SURE YOU:    Understand these instructions.   Will watch your condition.   Will get help right away if you are not doing well or get worse.  Document Released: 10/29/2005 Document Revised: 01/21/2012 Document Reviewed: 09/07/2008  ExitCare Patient Information 2013 [employer].    Sinusitis  Sinusitis is redness, soreness, and swelling (inflammation) of the paranasal sinuses. Paranasal sinuses are air pockets within the bones of your face (beneath the eyes, the middle of the forehead, or above the eyes). In healthy paranasal sinuses, mucus is able to drain out, and air is able to circulate through them by way of your nose. However, when your paranasal sinuses are inflamed, mucus and air can become trapped. This can allow bacteria and other germs to grow and cause infection.  Sinusitis can develop quickly and last only a short time (acute) or continue over a long period (chronic). Sinusitis that lasts for more than 12 weeks is considered chronic.   CAUSES   Causes of sinusitis include:   Allergies.     Structural abnormalities, such as displacement of the cartilage that separates your nostrils (deviated septum), which can decrease the air flow through your nose and sinuses and affect sinus drainage.   Functional abnormalities, such as when the small hairs (cilia) that line your sinuses and help remove mucus do not work properly or are not present.  SYMPTOMS   Symptoms of acute and chronic sinusitis are the same. The primary symptoms are pain and pressure around the affected  sinuses. Other symptoms include:   Upper toothache.   Earache.   Headache.   Bad breath.   Decreased sense of smell and taste.   A cough, which worsens when you are lying flat.   Fatigue.   Fever.   Thick drainage from your nose, which often is green and may contain pus (purulent).   Swelling and warmth over the affected sinuses.  DIAGNOSIS   Your caregiver will perform a physical exam. During the exam, your caregiver may:   Look in your nose for signs of abnormal growths in your nostrils (nasal polyps).   Tap over the affected sinus to check for signs of infection.   View the inside of your sinuses (endoscopy) with a special imaging device with a light attached (endoscope), which is inserted into your sinuses.  If your caregiver suspects that you have chronic sinusitis, one or more of the following tests may be recommended:   Allergy tests.   Nasal culture A sample of mucus is taken from your nose and sent to a lab and screened for bacteria.   Nasal cytology A sample of mucus is taken from your nose and examined by your caregiver to determine if your sinusitis is related to an allergy.  TREATMENT   Most cases of acute sinusitis are related to a viral infection and will resolve on their own within 10 days. Sometimes medicines are prescribed to help relieve symptoms (pain medicine, decongestants, nasal steroid sprays, or saline sprays).   However, for sinusitis related to a bacterial infection, your caregiver will prescribe antibiotic medicines. These are medicines that will help kill the bacteria causing the infection.   Rarely, sinusitis is caused by a fungal infection. In theses cases, your caregiver will prescribe antifungal medicine.  For some cases of chronic sinusitis, surgery is needed. Generally, these are cases in which sinusitis recurs more than 3 times per year, despite other treatments.  HOME CARE INSTRUCTIONS    Drink plenty of water. Water helps thin the mucus so your sinuses can drain  more easily.   Use a humidifier.   Inhale steam 3 to 4 times a day (for example, sit in the bathroom with the shower running).   Apply a warm, moist washcloth to your face 3 to 4 times a day, or as directed by your caregiver.   Use saline nasal sprays to help moisten and clean your sinuses.   Take over-the-counter or prescription medicines for pain, discomfort, or fever only as directed by your caregiver.  SEEK IMMEDIATE MEDICAL CARE IF:   You have increasing pain or severe headaches.   You have nausea, vomiting, or drowsiness.   You have swelling around your face.   You have vision problems.   You have a stiff neck.   You have difficulty breathing.  MAKE SURE YOU:    Understand these instructions.   Will watch your condition.   Will get help right away if you are not doing well or get worse.  Document Released: 10/29/2005 Document Revised: 01/21/2012 Document

## 2013-02-17 ENCOUNTER — Telehealth: Payer: Self-pay

## 2013-02-17 NOTE — Telephone Encounter (Signed)
Patient said she was here and received antibiotics for sinus infection it has given her a yeast infection could we call in diflucan to [facility]

## 2013-02-18 MED ORDER — FLUCONAZOLE 150 MG PO TABS: 150.0000 mg | ORAL_TABLET | Freq: Once | ORAL | Status: DC

## 2013-02-18 NOTE — Telephone Encounter (Signed)
[facility] RX sent and to RTC if symptoms persist.

## 2013-02-18 NOTE — Telephone Encounter (Signed)
Fluconazole sent to pharmacy.  If symptoms do no resolves, needs to RTC

## 2013-04-27 ENCOUNTER — Ambulatory Visit (INDEPENDENT_AMBULATORY_CARE_PROVIDER_SITE_OTHER): Payer: BC Managed Care – PPO | Admitting: Family Medicine

## 2013-04-27 VITALS — BP 118/76 | HR 80 | Temp 98.0°F | Resp 16 | Ht 66.0 in | Wt 215.0 lb

## 2013-04-27 DIAGNOSIS — J019 Acute sinusitis, unspecified: Secondary | ICD-10-CM

## 2013-04-27 DIAGNOSIS — J329 Chronic sinusitis, unspecified: Secondary | ICD-10-CM

## 2013-04-27 MED ORDER — AZITHROMYCIN 250 MG PO TABS: ORAL_TABLET | ORAL | Status: DC

## 2013-04-27 NOTE — Patient Instructions (Signed)

## 2013-04-27 NOTE — Progress Notes (Signed)
Patient ID: Veronica Esparza MRN: [id number], DOB: Feb 11, 1981, 32 y.o. Date of Encounter: 04/27/2013, 11:23 AM  Primary Physician: Dois Davenport., MD  Chief Complaint:  Chief Complaint  Patient presents with  . Sinusitis    x 6 days    HPI: 32 y.o. year old female presents with 7 day history of nasal congestion, post nasal drip, sore throat, sinus pressure, and cough. Afebrile. No chills. Nasal congestion thick and green/yellow. Sinus pressure is the worst symptom. Cough is productive secondary to post nasal drip and not associated with time of day. Ears feel full, leading to sensation of muffled hearing. Has tried OTC cold preps without success. No GI complaints.   No recent antibiotics, recent travels, or sick contacts   No leg trauma, sedentary periods, h/o cancer, or tobacco use.  Past Medical History  Diagnosis Date  . GERD (gastroesophageal reflux disease)   . Allergy   . Anxiety   . Depression   . Chronic tension headaches   . History of anorexia nervosa   . Webbing of toes      Home Meds: Prior to Admission medications   Medication Sig Start Date End Date Taking? Authorizing Provider  albuterol (PROVENTIL HFA;VENTOLIN HFA) 108 (90 BASE) MCG/ACT inhaler Inhale 2 puffs into the lungs every 6 (six) hours as needed. 01/15/12  Yes Shade Flood, MD  ALPRAZolam Prudy Feeler) 0.25 MG tablet Take 0.25 mg by mouth at bedtime as needed.   Yes Historical Provider, MD  cetirizine (ZYRTEC) 10 MG tablet Take 10 mg by mouth daily.   Yes Historical Provider, MD  FLUoxetine (PROZAC) 20 MG capsule Take 20 mg by mouth daily.   Yes Historical Provider, MD  fluticasone (FLONASE) 50 MCG/ACT nasal spray PLACE 2 SPRAYS INTO THE NOSE DAILY. 01/23/13  Yes Anders Simmonds, PA-C  Norethindrone-Ethinyl Estradiol-Fe (GENERESS FE) 0.8-25 MG-MCG tablet Chew 1 tablet by mouth daily.   Yes Historical Provider, MD  omeprazole (PRILOSEC OTC) 20 MG tablet Take 20 mg by mouth daily.   Yes Historical Provider, MD   albuterol (PROVENTIL HFA;VENTOLIN HFA) 108 (90 BASE) MCG/ACT inhaler Inhale 2 puffs into the lungs every 4 (four) hours as needed for wheezing (cough, shortness of breath or wheezing.). 12/12/12   Phillips Odor, MD  amoxicillin (AMOXIL) 875 MG tablet Take 1 tablet (875 mg total) by mouth 2 (two) times daily. 09/17/12   Phillips Odor, MD  amoxicillin-clavulanate (AUGMENTIN) 875-125 MG per tablet Take 1 tablet by mouth 2 (two) times daily. 02/06/13   Phillips Odor, MD  benzonatate (TESSALON) 100 MG capsule Take 100 mg by mouth 3 (three) times daily as needed.    Historical Provider, MD  drospirenone-ethinyl estradiol (YASMIN,ZARAH,SYEDA) 3-0.03 MG tablet Take 1 tablet by mouth daily.    Historical Provider, MD  fluconazole (DIFLUCAN) 150 MG tablet Take 1 tablet (150 mg total) by mouth once. Repeat if needed 02/18/13   Godfrey Pick, PA-C  predniSONE (DELTASONE) 20 MG tablet Take 2 by mouth for 5 days. 06/16/12   Collene Gobble, MD  pseudoephedrine-guaifenesin [facility] D) 60-600 MG per tablet Take 1 tablet by mouth every 12 (twelve) hours. 12/12/12 12/12/13  Phillips Odor, MD  pseudoephedrine-guaifenesin (MUCINEX D) 60-600 MG per tablet Take 1 tablet by mouth every 12 (twelve) hours. 02/06/13 02/06/14  Phillips Odor, MD    Allergies:  Allergies  Allergen Reactions  . Buspar (Buspirone Hcl)   . Codeine   . Sulfa Antibiotics     History   Social History  .  Marital Status: Single    Spouse Name: N/A    Number of Children: N/A  . Years of Education: N/A   Occupational History  . Not on file.   Social History Main Topics  . Smoking status: Former Games developer  . Smokeless tobacco: Never Used  . Alcohol Use: Yes     Comment: socially  . Drug Use: No  . Sexually Active: Not Currently   Other Topics Concern  . Not on file   Social History Narrative  . No narrative on file     Review of Systems: Constitutional: negative for chills, fever, night sweats or weight  changes Cardiovascular: negative for chest pain or palpitations Respiratory: negative for hemoptysis, wheezing, or shortness of breath Abdominal: negative for abdominal pain, nausea, vomiting or diarrhea Dermatological: negative for rash Neurologic: negative for headache   Physical Exam: Blood pressure 118/76, pulse 80, temperature 98 F (36.7 C), temperature source Oral, resp. rate 16, height 5\' 6"  (1.676 m), weight 215 lb (97.523 kg), SpO2 99.00%., Body mass index is 34.72 kg/(m^2). General: Well developed, well nourished, in no acute distress. Head: Normocephalic, atraumatic, eyes without discharge, sclera non-icteric, nares are congested. Bilateral auditory canals clear, TM's are without perforation, pearly grey with reflective cone of light bilaterally. Serous effusion bilaterally behind TM's. Maxillary sinus TTP. Oral cavity moist, dentition normal. Posterior pharynx with post nasal drip and mild erythema. No peritonsillar abscess or tonsillar exudate. Neck: Supple. No thyromegaly. Full ROM. No lymphadenopathy. Lungs: Clear bilaterally to auscultation without wheezes, rales, or rhonchi. Breathing is unlabored.  Heart: RRR with S1 S2. No murmurs, rubs, or gallops appreciated. Msk:  Strength and tone normal for age. Extremities: No clubbing or cyanosis. No edema. Neuro: Alert and oriented X 3. Moves all extremities spontaneously. CNII-XII grossly in tact. Psych:  Responds to questions appropriately with a normal affect.   Labs:   ASSESSMENT AND PLAN:  32 y.o. year old female with sinusitis Sinusitis - Plan: azithromycin (ZITHROMAX Z-PAK) 250 MG tablet  -Tylenol/Motrin prn -Rest/fluids -RTC precautions -RTC 3-5 days if no improvement  Signed, Elvina Sidle, MD 04/27/2013 11:23 AM

## 2013-07-21 ENCOUNTER — Ambulatory Visit (INDEPENDENT_AMBULATORY_CARE_PROVIDER_SITE_OTHER): Payer: BC Managed Care – PPO | Admitting: Family Medicine

## 2013-07-21 VITALS — BP 122/76 | HR 92 | Temp 98.0°F | Resp 17 | Ht 66.5 in | Wt 213.0 lb

## 2013-07-21 DIAGNOSIS — D649 Anemia, unspecified: Secondary | ICD-10-CM

## 2013-07-21 DIAGNOSIS — B9789 Other viral agents as the cause of diseases classified elsewhere: Secondary | ICD-10-CM

## 2013-07-21 DIAGNOSIS — R591 Generalized enlarged lymph nodes: Secondary | ICD-10-CM

## 2013-07-21 DIAGNOSIS — B349 Viral infection, unspecified: Secondary | ICD-10-CM

## 2013-07-21 DIAGNOSIS — R599 Enlarged lymph nodes, unspecified: Secondary | ICD-10-CM

## 2013-07-21 DIAGNOSIS — R509 Fever, unspecified: Secondary | ICD-10-CM

## 2013-07-21 LAB — POCT CBC
Granulocyte percent: 77.7 %G (ref 37–80)
HCT, POC: 37.1 % — AB (ref 37.7–47.9)
Hemoglobin: 11.8 g/dL — AB (ref 12.2–16.2)
Lymph, poc: 1.7 (ref 0.6–3.4)
MCHC: 31.8 g/dL (ref 31.8–35.4)
POC Granulocyte: 8.5 — AB (ref 2–6.9)

## 2013-07-21 NOTE — Progress Notes (Signed)
Subjective: 32 year old lady who has been feeling bad since this weekend. She woke up Saturday feeling like she had a hangover. She was achy, headache, nausea is. She said she had only had one beer so she notes wasn't that. 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 She continued intermittently running a fever last 4 days, between 98. 9 and 101.9 at home. She has been taking Tylenol for the fevers. She's not been able to keep the last 2 days. sHe feels lousy. Does have nausea.  She does have a very tender node swollen on the right ear. Continues with the headaches and body aches. No runny nose or sore throat or cough or urinary symptoms her stomach pains  Objective: Somewhat disheveled looking lady who obviously doesn't feel well. TMs are normal. Throat clear without erythema or exudate. Neck supple without any neck nodes but she has a large tender node behind right ear. Her chest is clear to auscultation. Heart regular without murmurs. Abdomen nontender.  Assessment: Probable viral syndrome with lymphadenopathy  Plan: CBC  Results for orders placed in visit on 07/21/13  POCT CBC      Result Value Range   WBC 10.9 (*) 4.6 - 10.2 K/uL   Lymph, poc 1.7  0.6 - 3.4   POC LYMPH PERCENT 15.9  10 - 50 %L   MID (cbc) 0.7  0 - 0.9   POC MID % 6.4  0 - 12 %M   POC Granulocyte 8.5 (*) 2 - 6.9   Granulocyte percent 77.7  37 - 80 %G   RBC 4.07  4.04 - 5.48 M/uL   Hemoglobin 11.8 (*) 12.2 - 16.2 g/dL   HCT, POC 16.1 (*) 09.6 - 47.9 %   MCV 91.1  80 - 97 fL   MCH, POC 29.0  27 - 31.2 pg   MCHC 31.8  31.8 - 35.4 g/dL   RDW, POC 04.5     Platelet Count, POC 335  142 - 424 K/uL   MPV 7.5  0 - 99.8 fL   Assessment: Leukocytosis and anemia in addition to the lymphadenopathy and generalized malaise.  Plan: Ibuprofen, fluids, rest, off school through tomorrow at least appear We'll give 600 mg ibuprofen now she thinks if he was going back up

## 2013-07-21 NOTE — Patient Instructions (Signed)
Fluids  Rest  Ibuprofen 600-800 mg every 6-8 hr for aching and inflammation  (maximum 2400 mg in 24 hr)  If more nodes swelling or symptoms persisting get rechecked.  Out of work through Advertising account executive or until [employer].   You are mildly anemic.  Advise iron daily for 2-3 months and then get rechecked.

## 2013-07-28 ENCOUNTER — Ambulatory Visit (INDEPENDENT_AMBULATORY_CARE_PROVIDER_SITE_OTHER): Payer: BC Managed Care – PPO | Admitting: Family Medicine

## 2013-07-28 ENCOUNTER — Other Ambulatory Visit: Payer: Self-pay | Admitting: Family Medicine

## 2013-07-28 VITALS — BP 126/76 | HR 75 | Temp 99.4°F | Resp 17 | Ht 66.0 in | Wt 215.0 lb

## 2013-07-28 DIAGNOSIS — R21 Rash and other nonspecific skin eruption: Secondary | ICD-10-CM

## 2013-07-28 DIAGNOSIS — B019 Varicella without complication: Secondary | ICD-10-CM

## 2013-07-28 MED ORDER — MICONAZOLE NITRATE 2 % EX CREA: TOPICAL_CREAM | Freq: Two times a day (BID) | CUTANEOUS | Status: DC

## 2013-07-28 MED ORDER — INSULIN ASPART 100 UNIT/ML ~~LOC~~ SOLN: 4.0000 [IU] | Freq: Once | SUBCUTANEOUS | Status: DC

## 2013-07-28 MED ORDER — METFORMIN HCL 500 MG PO TABS: 1000.0000 mg | ORAL_TABLET | Freq: Two times a day (BID) | ORAL | Status: DC

## 2013-07-28 NOTE — Progress Notes (Signed)
  Subjective:    Patient ID: Veronica Esparza, female    DOB: [birth year], 32 y.o.   MRN: [id number]  HPI 32 yo female presents for follow up from recent viral illness which she was seen here for on 07/21/13.  She had lymph node swelling and fever for several days which have been improving.  She presents today, however, with a new rash which has started to develop on her chest and her right arm.  It started on Saturday.  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    Patient states she may have had chicken pox as a child..  Past Medical History  Diagnosis Date  . GERD (gastroesophageal reflux disease)   . Allergy   . Anxiety   . Depression   . Chronic tension headaches   . History of anorexia nervosa   . Webbing of toes    Past Surgical History  Procedure Laterality Date  . Oral surgery    . Hand webbing excision    . Cosmetic surgery     Allergies  Allergen Reactions  . Buspar [Buspirone Hcl]   . Codeine   . Sulfa Antibiotics     Review of Systems  Constitutional: Positive for fever, chills and fatigue.  HENT: Negative for neck stiffness.   Respiratory: Negative for cough, shortness of breath and wheezing.   Cardiovascular: Negative for chest pain.  Skin: Positive for rash.  Neurological: Positive for headaches. Negative for weakness.  Hematological: Positive for adenopathy.       Objective:   Physical Exam  Blood pressure 126/76, pulse 75, temperature 99.4 F (37.4 C), temperature source Oral, resp. rate 17, height 5\' 6"  (1.676 m), weight 215 lb (97.523 kg), last menstrual period 06/30/2013, SpO2 96.00%. Body mass index is 34.72 kg/(m^2). Well-developed, well nourished female who is awake, alert and oriented, in NAD. HEENT: [place]/AT, PERRL, EOMI.  Sclera and conjunctiva are clear.  EAC are patent, TMs are normal in appearance. Nasal mucosa is pink and moist. OP is clear with no palatal  petechiae or exudates.  Posterior auricular nodes palpable. Neck: supple, non-tender, lymphadenopathy anterior cervical mild, thyromegaly. Heart: RRR, no murmur Lungs: normal effort, CTA Abdomen: normo-active bowel sounds, supple, non-tender, no mass or organomegaly. Extremities: no cyanosis, clubbing or edema. Skin: warm and dry.   Scattered vesicular rash on erythematous base on right arm.  Does not represent a dermatomal distribution. Psychologic: good mood and appropriate affect, normal speech and behavior.     Assessment & Plan:  1. Viral illness consistent with Varicella (Chicken Pox).  Advised patient that she should remain out of work until all vesicles crusted over and she has no new lesions.  Her rash first appeared 3 days ago, so she is not a candidate for antiviral therapy.  Work note given and viral culture sent.

## 2013-07-28 NOTE — Patient Instructions (Addendum)
You history and exam today are consistent with a possible chicken pox illness.    Chickenpox in Adults Chickenpox is an illness caused by a virus. This virus can spread easily from one person to another. Those with chickenpox almost never get it more than once. While it usually strikes children, adults who have never had the illness or vaccine can get chickenpox. In children, the illness is reasonably mild, although annoying. In adults, it can be very serious. CAUSES  A varicella-zoster virus causes chickenpox. The virus is passed in tiny droplets that the infected person coughs or sneezes into the air. Chickenpox can also be passed when someone comes into contact with the fluid produced by the chickenpox rash. When someone has been exposed to chickenpox, he or she usually comes down with the illness within about 10 to 21 days.  SYMPTOMS  The illness usually starts with:  Body aches and pain.  Headache.  Irritability.  Tiredness.  Fever.  Sore throat. A day or two later, a rash develops. The rash is made up of very itchy blisters. The rash lasts about 5 to 7 days. Each "chicken pock" heals over with a crusty scab. Chickenpox is very serious illness in adults. There is a higher risk of complications, including:  Pneumonia.  Skin infection.  Bone infection (osteomyelitis).  Joint infection (septic arthritis).  Brain infection (encephalitis).  Toxic shock syndrome.  Bleeding problems.  Problems with balance and muscle control (cerebellar ataxia).  Death. Women who get chickenpox during pregnancy have a higher risk of having a baby with birth defects. DIAGNOSIS  A diagnosis is based on the presence of the usual symptoms of achiness and fever along with the characteristic rash. If there is any question, blood tests can be done to diagnose the infection. TREATMENT  Treatment for chickenpox may include:  Taking the anti-viral drug acyclovir to shorten the length of the illness  and to decrease its severity. The drug has to be started within 24 hours of symptoms.  Taking medicine as directed by your caregiver.  Applying calamine lotion to decrease itchiness.  Baking soda or oatmeal baths to soothe itchy skin. When a person who has never had chickenpox has been exposed to the virus (especially a pregnant woman or a patient with HIV or AIDS) they might benefit from a shot of varicella-zoster immune globulin (VZIG). This helps prevent the person from actually coming down with the illness. VZIG must be given within 72 hours of exposure to the virus. HOME CARE INSTRUCTIONS   Only take over-the-counter or prescriptions medicines for pain, discomfort, or fever as directed by your caregiver.  Try taking a lukewarm (not hot) bath every few hours. Adding several tablespoons of baking soda or oatmeal may help make the bath more soothing.  Ice packs or cold washcloths applied to the rash may help improve itching.  Ask your caregiver if you may use an over-the-counter antihistamine (such as diphenhydramine) to decrease itching.  Wash your hands often. This helps lower the risk of a bacterial skin infection, as well as passing the virus to others. If you can, use alcohol-based rubs or wipes. If you cannot get these, use regular soap and water.  If you have blisters in your mouth, do not eat or drink spicy, salty, or acidic things. Soft, bland, cold foods and beverages will feel best.  Avoid people who have not had chickenpox or women who are pregnant. SEEK IMMEDIATE MEDICAL CARE IF:   You have a hard time breathing.  You have a severe headache.  You have a stiff neck.  You have severe joint pain or stiffness.  You feel disoriented or confused.  You are having trouble walking or keeping your balance.  You have an oral temperature above 102 F (38.9 C).  The area around one of the chickenpox becomes very red, hot to the touch, painful, or leaks pus. Document  Released: 08/07/2008 Document Revised: 01/21/2012 Document Reviewed: 08/07/2008 [facility] Patient Information 2014 [place], [place].

## 2013-07-29 NOTE — Progress Notes (Signed)
Patient discussed with Dr. McGrath. Agree with assessment and plan of care per his note.   

## 2013-07-31 LAB — HERPES SIMPLEX VIRUS CULTURE: Organism ID, Bacteria: NOT DETECTED

## 2013-08-27 ENCOUNTER — Ambulatory Visit (INDEPENDENT_AMBULATORY_CARE_PROVIDER_SITE_OTHER): Payer: BC Managed Care – PPO | Admitting: Family Medicine

## 2013-08-27 VITALS — BP 112/70 | HR 103 | Temp 98.9°F | Resp 16 | Ht 66.0 in | Wt 212.0 lb

## 2013-08-27 DIAGNOSIS — J029 Acute pharyngitis, unspecified: Secondary | ICD-10-CM

## 2013-08-27 DIAGNOSIS — J45901 Unspecified asthma with (acute) exacerbation: Secondary | ICD-10-CM

## 2013-08-27 DIAGNOSIS — J069 Acute upper respiratory infection, unspecified: Secondary | ICD-10-CM

## 2013-08-27 DIAGNOSIS — J309 Allergic rhinitis, unspecified: Secondary | ICD-10-CM

## 2013-08-27 LAB — POCT RAPID STREP A (OFFICE): Rapid Strep A Screen: NEGATIVE

## 2013-08-27 MED ORDER — AMOXICILLIN-POT CLAVULANATE 875-125 MG PO TABS: 1.0000 | ORAL_TABLET | Freq: Two times a day (BID) | ORAL | Status: DC

## 2013-08-27 NOTE — Patient Instructions (Signed)

## 2013-08-27 NOTE — Progress Notes (Signed)
[address]   [place], [place]  [address]   [phone]  Subjective:    Patient ID: Veronica Esparza, female    DOB: [birth year], 32 y.o.   MRN: [id number]  HPI This 32 y.o. female presents for evaluation of sore throat, cough.  History of allergies.  Onset one week ago.  No fever/chills/sweats.  +HA.  +ear pain L.  +ST R side.  Pain with swallowing.  +rhinorrhea; +nasal congestion; clear.  No PND.  Taking two allergies medications; Zyrtec and Flonase.  +coughing with wheezing.  Mild SOB.  Dry hacking cough.  No v/d.  Tussin cough syrup.  Not using Albuterol  Review of Systems  Constitutional: Negative for chills, diaphoresis and fatigue.  HENT: Positive for congestion, ear pain, rhinorrhea and sore throat.   Respiratory: Positive for cough, shortness of breath and wheezing.   Gastrointestinal: Negative for nausea, vomiting and diarrhea.  Neurological: Positive for headaches.   Past Medical History  Diagnosis Date  . GERD (gastroesophageal reflux disease)   . Allergy   . Anxiety   . Depression   . Chronic tension headaches   . History of anorexia nervosa   . Webbing of toes    Past Surgical History  Procedure Laterality Date  . Oral surgery    . Hand webbing excision    . Cosmetic surgery     Allergies  Allergen Reactions  . Buspar [Buspirone Hcl]   . Codeine   . Sulfa Antibiotics    History   Social History  . Marital Status: Single    Spouse Name: N/A    Number of Children: N/A  . Years of Education: N/A   Occupational History  . Not on file.   Social History Main Topics  . Smoking status: Former Games developer  . Smokeless tobacco: Never Used  . Alcohol Use: Yes     Comment: socially  . Drug Use: No  . Sexual Activity: Not Currently   Other Topics Concern  . Not on file   Social History Narrative  . No narrative on file       Objective:   Physical Exam  Nursing note and vitals reviewed. Constitutional: She is oriented to person, place, and time. She  appears well-developed and well-nourished. No distress.  HENT:  Head: Normocephalic and atraumatic.  Mouth/Throat: Uvula is midline and mucous membranes are normal. Posterior oropharyngeal erythema present. No oropharyngeal exudate or tonsillar abscesses.  Eyes: Conjunctivae and EOM are normal. Pupils are equal, round, and reactive to light.  Neck: Normal range of motion. Neck supple.  Cardiovascular: Normal rate, regular rhythm and normal heart sounds.  Exam reveals no gallop and no friction rub.   No murmur heard. Pulmonary/Chest: Effort normal and breath sounds normal. She has no wheezes. She has no rales.  Neurological: She is alert and oriented to person, place, and time.  Skin: Skin is warm and dry. No rash noted. She is not diaphoretic.  Psychiatric: She has a normal mood and affect. Her behavior is normal.      Results for orders placed in visit on 08/27/13  CULTURE, GROUP A STREP      Result Value Ref Range   Organism ID, Bacteria Normal Upper Respiratory Flora     Organism ID, Bacteria No Beta Hemolytic Streptococci Isolated    POCT RAPID STREP A (OFFICE)      Result Value Ref Range   Rapid Strep A Screen Negative  Negative       Assessment &  Plan:  Allergic rhinitis, cause unspecified - Plan: POCT rapid strep A, Culture, Group A Strep  Acute pharyngitis - Plan: POCT rapid strep A, Culture, Group A Strep  URI (upper respiratory infection)  Asthma with acute exacerbation  1.  Allergic Rhinitis:  Stable; continue current regimen. 2.  Sore throat/URI:  New.  Send throat culture; rx for Augmentin provided to fill in upcoming week if clinically worsens or fails to improve to treat evolving sinusitis.  Recommend Albuterol for cough. 3. Asthma:  With mild exacerbation; recommend Albuterol qid scheduled for one week and then PRN.  Meds ordered this encounter  Medications  . FLUoxetine (PROZAC) 10 MG capsule    Sig: Take 20 mg by mouth daily.  Marland Kitchen DISCONTD:  amoxicillin-clavulanate (AUGMENTIN) 875-125 MG per tablet    Sig: Take 1 tablet by mouth 2 (two) times daily.    Dispense:  20 tablet    Refill:  0   Nilda Simmer, M.D.  [facility] [address] [place], [place]  [address] [phone] phone [phone] fax

## 2013-08-30 LAB — CULTURE, GROUP A STREP

## 2013-09-03 ENCOUNTER — Telehealth: Payer: Self-pay

## 2013-09-03 MED ORDER — FLUCONAZOLE 150 MG PO TABS: 150.0000 mg | ORAL_TABLET | Freq: Once | ORAL | Status: DC

## 2013-09-03 NOTE — Telephone Encounter (Signed)
Thanks I called her to advise.  

## 2013-09-03 NOTE — Telephone Encounter (Signed)
Call --- rx for Diflucan sent to pharmacy.

## 2013-09-03 NOTE — Telephone Encounter (Signed)
Patient called she is having diarrhea with Abx.  Advised her this was normal as long as the diarrhea is not severe.  Patient states understanding.  Also, she is having some vaginal itching and would like a Rx for diflucan sent to her pharmacy.

## 2013-09-16 ENCOUNTER — Ambulatory Visit (INDEPENDENT_AMBULATORY_CARE_PROVIDER_SITE_OTHER): Payer: BC Managed Care – PPO | Admitting: Physician Assistant

## 2013-09-16 VITALS — BP 118/64 | HR 86 | Temp 98.1°F | Resp 18 | Ht 65.75 in | Wt 213.2 lb

## 2013-09-16 DIAGNOSIS — J40 Bronchitis, not specified as acute or chronic: Secondary | ICD-10-CM

## 2013-09-16 DIAGNOSIS — J9801 Acute bronchospasm: Secondary | ICD-10-CM

## 2013-09-16 DIAGNOSIS — R05 Cough: Secondary | ICD-10-CM

## 2013-09-16 MED ORDER — FLUCONAZOLE 150 MG PO TABS: 150.0000 mg | ORAL_TABLET | Freq: Once | ORAL | Status: DC

## 2013-09-16 MED ORDER — BENZONATATE 200 MG PO CAPS: 200.0000 mg | ORAL_CAPSULE | Freq: Three times a day (TID) | ORAL | Status: DC | PRN

## 2013-09-16 MED ORDER — AZITHROMYCIN 250 MG PO TABS: ORAL_TABLET | ORAL | Status: DC

## 2013-09-16 MED ORDER — PREDNISONE 20 MG PO TABS: ORAL_TABLET | ORAL | Status: DC

## 2013-09-16 NOTE — Progress Notes (Signed)
Patient ID: PHOEBE MARTER MRN: [id number], DOB: 1981-03-15, 32 y.o. Date of Encounter: 09/16/2013, 7:35 PM  Primary Physician: Dois Davenport., MD  Chief Complaint:  Chief Complaint  Patient presents with  . Cough  . Nasal Congestion  . Fatigue  . Headache    HPI: 32 y.o. female presents with a return of nasal congestion, rhinorrhea, post nasal drip, cough, and wheezing. Subjective fever and chills. Patient was initially evaluated on 08/27/13 with above similar symptoms and treated with Augmentin. She took 7-8 days of this and stopped. Symptoms did fully resolve, however they did return 6 days ago with the above. Cough is mildly productive and worse after work and late at night. Some wheezing, but no SOB. This is typical of her URI's. No N/V/D. Albuterol has helped. SHe does some when she drinks.    No sick contacts, or recent travels.   No leg trauma, sedentary periods, or h/o cancer  Past Medical History  Diagnosis Date  . GERD (gastroesophageal reflux disease)   . Allergy   . Anxiety   . Depression   . Chronic tension headaches   . History of anorexia nervosa   . Webbing of toes      Home Meds: Prior to Admission medications   Medication Sig Start Date End Date Taking? Authorizing Provider  albuterol (PROVENTIL HFA;VENTOLIN HFA) 108 (90 BASE) MCG/ACT inhaler Inhale 2 puffs into the lungs every 4 (four) hours as needed for wheezing (cough, shortness of breath or wheezing.). 12/12/12  Yes Phillips Odor, MD  ALPRAZolam Prudy Feeler) 0.25 MG tablet Take 0.25 mg by mouth at bedtime as needed.   Yes Historical Provider, MD  FLUoxetine (PROZAC) 10 MG capsule Take 20 mg by mouth daily.   Yes Historical Provider, MD  fluticasone (FLONASE) 50 MCG/ACT nasal spray PLACE 2 SPRAYS INTO THE NOSE DAILY. 01/23/13  Yes Anders Simmonds, PA-C  Norethindrone-Ethinyl Estradiol-Fe (GENERESS FE) 0.8-25 MG-MCG tablet Chew 1 tablet by mouth daily.   Yes Historical Provider, MD  omeprazole (PRILOSEC  OTC) 20 MG tablet Take 20 mg by mouth daily.   Yes Historical Provider, MD                                Allergies:  Allergies  Allergen Reactions  . Buspar [Buspirone Hcl]   . Codeine   . Sulfa Antibiotics     History   Social History  . Marital Status: Single    Spouse Name: N/A    Number of Children: N/A  . Years of Education: N/A   Occupational History  . Not on file.   Social History Main Topics  . Smoking status: Former Games developer  . Smokeless tobacco: Never Used  . Alcohol Use: Yes     Comment: socially  . Drug Use: No  . Sexual Activity: Not Currently   Other Topics Concern  . Not on file   Social History Narrative  . No narrative on file     Review of Systems: Constitutional: positive for fatigue. negative for chills or fever HEENT: see above Cardiovascular: negative for chest pain or palpitations Respiratory: positive for cough and wheezing. negative for shortness of breath Abdominal: negative for abdominal pain, nausea, vomiting or diarrhea Dermatological: negative for rash Neurologic: negative for headache   Physical Exam: Blood pressure 118/64, pulse 86, temperature 98.1 F (36.7 C), temperature source Oral, resp. rate 18, height 5' 5.75" (1.67 m), weight 213 lb  3.2 oz (96.707 kg), last menstrual period 09/11/2013, SpO2 97.00%., Body mass index is 34.68 kg/(m^2). General: Well developed, well nourished, in no acute distress. Head: Normocephalic, atraumatic, eyes without discharge, sclera non-icteric, nares are congested. Bilateral auditory canals clear, TM's are without perforation, pearly grey with reflective cone of light bilaterally. No sinus TTP. Oral cavity moist, dentition normal. Posterior pharynx with post nasal drip and mild erythema. No peritonsillar abscess or tonsillar exudate. Uvula midline.  Neck: Supple. No thyromegaly. Full ROM. No lymphadenopathy. Lungs: Coarse breath sounds bilaterally without wheezes, rales, or rhonchi. Breathing  is unlabored.  Heart: RRR with S1 S2. No murmurs, rubs, or gallops appreciated. Msk:  Strength and tone normal for age. Extremities: No clubbing or cyanosis. No edema. Neuro: Alert and oriented X 3. Moves all extremities spontaneously. CNII-XII grossly in tact. Psych:  Responds to questions appropriately with a normal affect.     ASSESSMENT AND PLAN:  32 y.o. female with bronchitis, bronchospasm, and cough.  -Azithromycin 250 MG #6 2 po first day then 1 po next 4 days no RF -Prednisone 20 mg 2 po daily for 3 days #6 no RF, SED -Tessalon Perles 200 mg 1 po tid prn cough #30 no RF -Diflucan 150 mg 1 po if needed for vaginal candidiasis #1 no RF  -Stop smoking, patient understands and agrees to this, but she is upset about it -Mucinex -Tylenol/Motrin prn -Rest/fluids -RTC precautions -RTC 3-5 days if no improvement  Signed, Eula Listen, PA-C [facility] [place], [place] [address] [phone] 09/16/2013 7:35 PM

## 2013-11-04 ENCOUNTER — Ambulatory Visit (INDEPENDENT_AMBULATORY_CARE_PROVIDER_SITE_OTHER): Payer: BC Managed Care – PPO | Admitting: Internal Medicine

## 2013-11-04 VITALS — BP 120/70 | HR 81 | Temp 98.4°F | Resp 16 | Ht 66.52 in | Wt 217.0 lb

## 2013-11-04 DIAGNOSIS — J019 Acute sinusitis, unspecified: Secondary | ICD-10-CM

## 2013-11-04 MED ORDER — AZITHROMYCIN 250 MG PO TABS: ORAL_TABLET | ORAL | Status: DC

## 2013-11-04 NOTE — Progress Notes (Signed)
   Subjective:    Patient ID: Veronica Esparza, female    DOB: Jan 25, 1981, 32 y.o.   MRN: [id number]  HPI  32 y.o. Female presents to clinic with sinus congestion and drainage for last 3-4 days. Has hx of trouble with allergies and recurrent sinus infections. Denies any trouble with sleeping. Has pressure in face upon bending over.  No cough/no sore throat/no wheezing/no fever Review of Systems Noncontributory    Objective:   Physical Exam BP 120/70  Pulse 81  Temp(Src) 98.4 F (36.9 C) (Oral)  Resp 16  Ht 5' 6.52" (1.69 m)  Wt 217 lb (98.431 kg)  BMI 34.46 kg/m2  SpO2 98%  LMP 10/06/2013 Conjunctiva clear TMs clear Nares boggy with purulent discharge Tender maxillary Throat clear No nodes       Assessment & Plan:  Sinusitis  Meds ordered this encounter  Medications  . azithromycin (ZITHROMAX) 250 MG tablet--- this has worked for her in the past     Sig: As packaged    Dispense:  6 tablet    Refill:  0   12 hour Sudafed Fluids

## 2014-01-31 ENCOUNTER — Other Ambulatory Visit: Payer: Self-pay | Admitting: Physician Assistant

## 2014-03-23 ENCOUNTER — Ambulatory Visit (INDEPENDENT_AMBULATORY_CARE_PROVIDER_SITE_OTHER): Payer: BC Managed Care – PPO | Admitting: Emergency Medicine

## 2014-03-23 VITALS — BP 126/74 | HR 73 | Temp 98.3°F | Resp 16 | Wt 220.0 lb

## 2014-03-23 DIAGNOSIS — J329 Chronic sinusitis, unspecified: Secondary | ICD-10-CM

## 2014-03-23 MED ORDER — AZITHROMYCIN 250 MG PO TABS: ORAL_TABLET | ORAL | Status: DC

## 2014-03-23 NOTE — Patient Instructions (Signed)

## 2014-03-23 NOTE — Progress Notes (Signed)
   Subjective:    Patient ID: Veronica Esparza, female    DOB: 12-16-1980, 33 y.o.   MRN: [id number]  HPI 33 yo female with complaint of sinus infection.  Worsening for past 5 days.  Pain in facial region.  Post nasal drip.  History of similar in past.  Treated in past with z-pak.  Patient states symptoms started as allergies and then started to worsen.  PPMH:  Depression and GERD  SH:  First grade teacher.   Review of Systems  Constitutional: Negative for fever and chills.  HENT: Positive for congestion, postnasal drip, rhinorrhea and sinus pressure. Negative for sore throat and trouble swallowing.   Respiratory: Negative for cough, shortness of breath and wheezing.        Objective:   Physical Exam Blood pressure 126/74, pulse 73, temperature 98.3 F (36.8 C), temperature source Oral, resp. rate 16, weight 220 lb (99.791 kg), last menstrual period 02/21/2014, SpO2 99.00%. Body mass index is 34.94 kg/(m^2). Well-developed, well nourished female who is awake, alert and oriented, in NAD. HEENT: [place]/AT, PERRL, EOMI.  Sclera and conjunctiva are clear.   Nasal mucosa is erythematous and edematous. OP is clear. Lungs: normal effort Abdomen: normo-active bowel sounds, supple, non-tender, no mass or organomegaly. Extremities: no cyanosis, clubbing or edema. Skin: warm and dry without rash. Psychologic: good mood and appropriate affect, normal speech and behavior.     Assessment & Plan:  Sinusitis, will start on z-pak and recommend to continue zyrtec and flonase.  Consider zyrtec-D if congestion continues.

## 2014-03-23 NOTE — Progress Notes (Signed)
Pre-visit discussion using our clinic review tool, as applicable. No additional management support is needed unless otherwise documented below in the visit note.  

## 2014-07-30 ENCOUNTER — Ambulatory Visit (INDEPENDENT_AMBULATORY_CARE_PROVIDER_SITE_OTHER): Payer: BC Managed Care – PPO | Admitting: Family Medicine

## 2014-07-30 VITALS — BP 130/75 | HR 83 | Temp 98.4°F | Resp 16 | Ht 66.0 in | Wt 225.0 lb

## 2014-07-30 DIAGNOSIS — R059 Cough, unspecified: Secondary | ICD-10-CM

## 2014-07-30 DIAGNOSIS — J0120 Acute ethmoidal sinusitis, unspecified: Secondary | ICD-10-CM

## 2014-07-30 DIAGNOSIS — R05 Cough: Secondary | ICD-10-CM

## 2014-07-30 DIAGNOSIS — J029 Acute pharyngitis, unspecified: Secondary | ICD-10-CM

## 2014-07-30 LAB — POCT RAPID STREP A (OFFICE): Rapid Strep A Screen: NEGATIVE

## 2014-07-30 MED ORDER — AZITHROMYCIN 250 MG PO TABS: ORAL_TABLET | ORAL | Status: DC

## 2014-07-30 MED ORDER — ALBUTEROL SULFATE HFA 108 (90 BASE) MCG/ACT IN AERS: 2.0000 | INHALATION_SPRAY | RESPIRATORY_TRACT | Status: AC | PRN

## 2014-07-30 MED ORDER — BENZONATATE 100 MG PO CAPS: 200.0000 mg | ORAL_CAPSULE | Freq: Two times a day (BID) | ORAL | Status: DC | PRN

## 2014-07-30 NOTE — Progress Notes (Signed)
Chief Complaint:  Chief Complaint  Patient presents with  . Nasal Congestion    sinus pressure and drainage x 4 days  . Cough    x yesterday, dry    HPI: Veronica Esparza is a 33 y.o. female who is here for sinus sxs and feels it in her chest for the last 5 days She has a sore throat, she has had cough dry cough has some ear pressure, jaw and teeth hurt, sometimes her teeth still hurt No fevers. HAs had allergies, she has some chest tighness when she gets sick and she uses it and it helps with cough She has tried otc meds without releif   Past Medical History  Diagnosis Date  . GERD (gastroesophageal reflux disease)   . Allergy   . Anxiety   . Depression   . Chronic tension headaches   . History of anorexia nervosa   . Webbing of toes    Past Surgical History  Procedure Laterality Date  . Oral surgery    . Hand webbing excision    . Cosmetic surgery     History   Social History  . Marital Status: Single    Spouse Name: N/A    Number of Children: N/A  . Years of Education: N/A   Social History Main Topics  . Smoking status: Former Games developer  . Smokeless tobacco: Never Used  . Alcohol Use: Yes     Comment: socially  . Drug Use: No  . Sexual Activity: Not Currently   Other Topics Concern  . None   Social History Narrative  . None   Family History  Problem Relation Age of Onset  . Cancer Father   . Hypertension Father   . Stroke Father   . Diabetes Mother   . Hypothyroidism Mother   . Depression Brother   . Anxiety disorder Brother   . COPD Maternal Grandmother   . Heart disease Maternal Grandfather   . Diabetes Paternal Grandmother   . Cancer Paternal Grandfather    Allergies  Allergen Reactions  . Buspar [Buspirone Hcl]   . Codeine   . Sulfa Antibiotics    Prior to Admission medications   Medication Sig Start Date End Date Taking? Authorizing Provider  albuterol (PROVENTIL HFA;VENTOLIN HFA) 108 (90 BASE) MCG/ACT inhaler Inhale 2 puffs into  the lungs every 4 (four) hours as needed for wheezing (cough, shortness of breath or wheezing.). 12/12/12  Yes Veronica Dane, MD  ALPRAZolam Prudy Feeler) 0.25 MG tablet Take 0.25 mg by mouth at bedtime as needed.   Yes Historical Provider, MD  cetirizine (ZYRTEC) 5 MG tablet Take 5 mg by mouth daily.   Yes Historical Provider, MD  FLUoxetine (PROZAC) 10 MG capsule Take 20 mg by mouth daily.   Yes Historical Provider, MD  fluticasone (FLONASE) 50 MCG/ACT nasal spray USE 2 SPRAYS INTO NOSE DAILY   Yes Veronica S Jeffery, PA-C  Norethindrone-Ethinyl Estradiol-Fe (GENERESS FE) 0.8-25 MG-MCG tablet Chew 1 tablet by mouth daily.   Yes Historical Provider, MD  omeprazole (PRILOSEC OTC) 20 MG tablet Take 20 mg by mouth daily.   Yes Historical Provider, MD  azithromycin (ZITHROMAX) 250 MG tablet As packaged 11/04/13   Veronica Pearson, MD  azithromycin (ZITHROMAX) 250 MG tablet Take 2 tabs PO x 1 dose, then 1 tab PO QD x 4 days 03/23/14   Veronica Kos, MD     ROS: The patient denies fevers, chills, night sweats, unintentional weight  loss, chest pain, palpitations, wheezing, dyspnea on exertion, nausea, vomiting, abdominal pain, dysuria, hematuria, melena, numbness, weakness, or tingling.   All other systems have been reviewed and were otherwise negative with the exception of those mentioned in the HPI and as above.    PHYSICAL EXAM: Filed Vitals:   07/30/14 1826  BP: 130/75  Pulse: 83  Temp: 98.4 F (36.9 C)  Resp: 16   Filed Vitals:   07/30/14 1826  Height:  (1.676 m)  Weight: 225 lb (102.059 kg)   Body mass index is 36.33 kg/(m^2).  General: Alert, no acute distress HEENT:  Normocephalic, atraumatic, oropharynx patent. EOMI, PERRLA, + sinus tenderness, no exudates, Tm nl Cardiovascular:  Regular rate and rhythm, no rubs murmurs or gallops.  No Carotid bruits, radial pulse intact. No pedal edema.  Respiratory: Clear to auscultation bilaterally.  No wheezes, rales, or rhonchi.  No  cyanosis, no use of accessory musculature GI: No organomegaly, abdomen is soft and non-tender, positive bowel sounds.  No masses. Skin: No rashes. Neurologic: Facial musculature symmetric. Psychiatric: Patient is appropriate throughout our interaction. Lymphatic: No cervical lymphadenopathy Musculoskeletal: Gait intact.   LABS: Results for orders placed in visit on 07/30/14  POCT RAPID STREP A (OFFICE)      Result Value Ref Range   Rapid Strep A Screen Negative  Negative     EKG/XRAY:   Primary read interpreted by Veronica Esparza at [facility].   ASSESSMENT/PLAN: Encounter Diagnoses  Name Primary?  . Acute pharyngitis, unspecified pharyngitis type   . Acute ethmoidal sinusitis, recurrence not specified Yes  . Cough    Rx Azithromcyin Rx Tessalon perles, cw flonase F/u prn  Gross sideeffects, risk and benefits, and alternatives of medications d/w patient. Patient is aware that all medications have potential sideeffects and we are unable to predict every sideeffect or drug-drug interaction that may occur.  Veronica Scioli PHUONG, DO 07/30/2014 7:23 PM

## 2014-07-30 NOTE — Patient Instructions (Signed)

## 2014-08-06 ENCOUNTER — Ambulatory Visit (INDEPENDENT_AMBULATORY_CARE_PROVIDER_SITE_OTHER): Payer: BC Managed Care – PPO | Admitting: Physician Assistant

## 2014-08-06 VITALS — BP 122/78 | HR 79 | Temp 97.8°F | Resp 16 | Ht 65.5 in | Wt 221.0 lb

## 2014-08-06 DIAGNOSIS — Z8639 Personal history of other endocrine, nutritional and metabolic disease: Secondary | ICD-10-CM

## 2014-08-06 DIAGNOSIS — R599 Enlarged lymph nodes, unspecified: Secondary | ICD-10-CM

## 2014-08-06 DIAGNOSIS — Z833 Family history of diabetes mellitus: Secondary | ICD-10-CM

## 2014-08-06 DIAGNOSIS — R591 Generalized enlarged lymph nodes: Secondary | ICD-10-CM

## 2014-08-06 DIAGNOSIS — R059 Cough, unspecified: Secondary | ICD-10-CM

## 2014-08-06 DIAGNOSIS — Z862 Personal history of diseases of the blood and blood-forming organs and certain disorders involving the immune mechanism: Secondary | ICD-10-CM

## 2014-08-06 DIAGNOSIS — R05 Cough: Secondary | ICD-10-CM

## 2014-08-06 LAB — POCT CBC
Granulocyte percent: 62.1 %G (ref 37–80)
HCT, POC: 37.1 % — AB (ref 37.7–47.9)
Hemoglobin: 12.4 g/dL (ref 12.2–16.2)
LYMPH, POC: 3.6 — AB (ref 0.6–3.4)
MCH: 29.7 pg (ref 27–31.2)
MCHC: 33.4 g/dL (ref 31.8–35.4)
MCV: 88.9 fL (ref 80–97)
MID (CBC): 0.2 (ref 0–0.9)
MPV: 7.1 fL (ref 0–99.8)
PLATELET COUNT, POC: 367 10*3/uL (ref 142–424)
POC Granulocyte: 6.1 (ref 2–6.9)
POC LYMPH %: 36.1 % (ref 10–50)
POC MID %: 1.8 %M (ref 0–12)
RBC: 4.18 M/uL (ref 4.04–5.48)
RDW, POC: 13.4 %
WBC: 9.9 10*3/uL (ref 4.6–10.2)

## 2014-08-06 LAB — POCT GLYCOSYLATED HEMOGLOBIN (HGB A1C): Hemoglobin A1C: 6

## 2014-08-06 MED ORDER — MELOXICAM 7.5 MG PO TABS: 7.5000 mg | ORAL_TABLET | Freq: Every day | ORAL | Status: DC

## 2014-08-06 MED ORDER — PREDNISONE 10 MG PO TABS: ORAL_TABLET | ORAL | Status: DC

## 2014-08-06 NOTE — Progress Notes (Signed)
Subjective:    Patient ID: Veronica Esparza, female    DOB: 1980/12/17, 33 y.o.   MRN: [id number]  HPI Pt presents to clinic with 2 day h/o right ear pain and swollen apainful lymph nodes on the right side of her neck.  She was treated for a sinus infection last week and that feels much better. She has had no fevers or chills.  She has h/o TMJ and the pain in her jaw feels a little like that.  Her cough is dry and is deep - she has h/o RAD and has an albuterol inhaler at home that she has used and it does help the cough.  She sleeps fine at night.  She has h/o elevated glucose and gets her A1C checked throughout the year - she has gained some weight with take-out food though she drinks no sugary drinks she eats a lot of fries.  She is worried that it might be higher than normal.  Review of Systems  Constitutional: Negative for fever and chills.  HENT: Positive for congestion (mild). Negative for sinus pressure and sore throat.   Respiratory: Positive for cough. Negative for shortness of breath and wheezing.        Objective:   Physical Exam  Vitals reviewed. Constitutional: She is oriented to person, place, and time. She appears well-developed and well-nourished.  HENT:  Head: Normocephalic and atraumatic.  Right Ear: External ear normal.  Left Ear: External ear normal.  TTP over left TMJ.  No teeth or sinus TTP.  Eyes: Conjunctivae are normal.  Neck: Normal range of motion.  Cardiovascular: Normal rate, regular rhythm and normal heart sounds.   No murmur heard. Pulmonary/Chest: Effort normal and breath sounds normal. She has no wheezes.  Deep breaths induce a dry deep cough -   Lymphadenopathy:       Head (right side): No tonsillar and no occipital adenopathy present.       Head (left side): Occipital adenopathy present. No tonsillar adenopathy present.    She has cervical adenopathy.       Right cervical: No superficial cervical and no deep cervical adenopathy present.      Left  cervical: Superficial cervical and deep cervical adenopathy present.       Right: No supraclavicular adenopathy present.       Left: No supraclavicular adenopathy present.  Neurological: She is alert and oriented to person, place, and time.  Skin: Skin is warm and dry.  Psychiatric: She has a normal mood and affect. Her behavior is normal. Judgment and thought content normal.    Results for orders placed in visit on 08/06/14  POCT CBC      Result Value Ref Range   WBC 9.9  4.6 - 10.2 K/uL   Lymph, poc 3.6 (*) 0.6 - 3.4   POC LYMPH PERCENT 36.1  10 - 50 %L   MID (cbc) 0.2  0 - 0.9   POC MID % 1.8  0 - 12 %M   POC Granulocyte 6.1  2 - 6.9   Granulocyte percent 62.1  37 - 80 %G   RBC 4.18  4.04 - 5.48 M/uL   Hemoglobin 12.4  12.2 - 16.2 g/dL   HCT, POC 82.9 (*) 56.2 - 47.9 %   MCV 88.9  80 - 97 fL   MCH, POC 29.7  27 - 31.2 pg   MCHC 33.4  31.8 - 35.4 g/dL   RDW, POC 13.0     Platelet Count,  POC 367  142 - 424 K/uL   MPV 7.1  0 - 99.8 fL  POCT GLYCOSYLATED HEMOGLOBIN (HGB A1C)      Result Value Ref Range   Hemoglobin A1C 6.0         Assessment & Plan:  Lymphadenopathy - Plan: POCT CBC, meloxicam (MOBIC) 7.5 MG tablet (do not take while on the prednisone)  Cough - Due to length of time with the cough and the fact that the albuterol helps the cough and it is dry we will cover with steroid for the inflammatory cough process. This should also help the lymphadenopathy.  Plan: predniSONE (DELTASONE) 10 MG tablet  H/O hyperglycemia - She needs to be careful with her carb and sugar intake.  Plan: POCT glycosylated hemoglobin (Hb A1C)  Family history of diabetes mellitus - Plan: POCT glycosylated hemoglobin (Hb A1C)  Benny Lennert PA-C  [facility] 08/06/2014 7:17 PM

## 2014-10-06 ENCOUNTER — Ambulatory Visit (INDEPENDENT_AMBULATORY_CARE_PROVIDER_SITE_OTHER): Payer: BC Managed Care – PPO | Admitting: Emergency Medicine

## 2014-10-06 VITALS — BP 108/64 | HR 89 | Temp 98.5°F | Resp 18 | Ht 67.0 in | Wt 225.0 lb

## 2014-10-06 DIAGNOSIS — J209 Acute bronchitis, unspecified: Secondary | ICD-10-CM

## 2014-10-06 MED ORDER — AZITHROMYCIN 250 MG PO TABS: ORAL_TABLET | ORAL | Status: DC

## 2014-10-06 MED ORDER — GUAIFENESIN-DM 100-10 MG/5ML PO SYRP: 5.0000 mL | ORAL_SOLUTION | ORAL | Status: DC | PRN

## 2014-10-06 NOTE — Patient Instructions (Signed)

## 2014-10-06 NOTE — Progress Notes (Signed)
[facility] [address], [place] [id number] [phone]  Date:  10/06/2014   Name:  Doran HeaterJamie L Rounds   DOB:  01/28/1981   MRN:  [id number]  PCP:  Dois DavenportICHTER,KAREN L., MD    Chief Complaint: Cough; Generalized Body Aches; Headache; and Nasal Congestion   History of Present Illness:  Doran HeaterJamie L Parrow is a [age] y.o. very pleasant female patient who presents with the following:  Ill this week with nasal congestion and post nasal drainage. Has a cough productive purulent sputum No wheezing or shortness of breath.   No fever or chills. No nausea or vomiting Teaches first grade. No improvement with over the counter medications or other home remedies.  Denies other complaint or health concern today.   Patient Active Problem List   Diagnosis Date Noted  . GERD (gastroesophageal reflux disease) 01/15/2012  . Depression with anxiety 01/15/2012    Past Medical History  Diagnosis Date  . GERD (gastroesophageal reflux disease)   . Allergy   . Anxiety   . Depression   . Chronic tension headaches   . History of anorexia nervosa   . Webbing of toes     Past Surgical History  Procedure Laterality Date  . Oral surgery    . Hand webbing excision    . Cosmetic surgery      History  Substance Use Topics  . Smoking status: Former Games developermoker  . Smokeless tobacco: Never Used  . Alcohol Use: Yes     Comment: socially    Family History  Problem Relation Age of Onset  . Cancer Father   . Hypertension Father   . Stroke Father   . Diabetes Mother   . Hypothyroidism Mother   . Depression Brother   . Anxiety disorder Brother   . COPD Maternal Grandmother   . Heart disease Maternal Grandfather   . Diabetes Paternal Grandmother   . Cancer Paternal Grandfather     Allergies  Allergen Reactions  . Buspar [Buspirone Hcl]   . Codeine   . Sulfa Antibiotics     Medication list has been reviewed and updated.  Current Outpatient Prescriptions on File Prior to Visit   Medication Sig Dispense Refill  . albuterol (PROVENTIL HFA;VENTOLIN HFA) 108 (90 BASE) MCG/ACT inhaler Inhale 2 puffs into the lungs every 4 (four) hours as needed for wheezing (cough, shortness of breath or wheezing.). 1 Inhaler 1  . ALPRAZolam (XANAX) 0.25 MG tablet Take 0.25 mg by mouth at bedtime as needed.    . cetirizine (ZYRTEC) 5 MG tablet Take 5 mg by mouth daily.    Marland Kitchen. FLUoxetine (PROZAC) 10 MG capsule Take 20 mg by mouth daily.    . fluticasone (FLONASE) 50 MCG/ACT nasal spray USE 2 SPRAYS INTO NOSE DAILY 16 g 8  . Norethindrone-Ethinyl Estradiol-Fe (GENERESS FE) 0.8-25 MG-MCG tablet Chew 1 tablet by mouth daily.    Marland Kitchen. omeprazole (PRILOSEC OTC) 20 MG tablet Take 20 mg by mouth daily.    . benzonatate (TESSALON) 100 MG capsule Take 2 capsules (200 mg total) by mouth 2 (two) times daily as needed. (Patient not taking: Reported on 10/06/2014) 30 capsule 1  . meloxicam (MOBIC) 7.5 MG tablet Take 1 tablet (7.5 mg total) by mouth daily. Do not take while on the prednisone. (Patient not taking: Reported on 10/06/2014) 30 tablet 0  . predniSONE (DELTASONE) 10 MG tablet 3 po qd for 2 days, 2 po qd for 2 days, 1 po 2 days (Patient  not taking: Reported on 10/06/2014) 12 tablet 0   No current facility-administered medications on file prior to visit.    Review of Systems:  As per HPI, otherwise negative.    Physical Examination: Filed Vitals:   10/06/14 1434  BP: 108/64  Pulse: 89  Temp: 98.5 F (36.9 C)  Resp: 18   Filed Vitals:   10/06/14 1434  Height: 5\' 7"  (1.702 m)  Weight: 225 lb (102.059 kg)   Body mass index is 35.23 kg/(m^2). Ideal Body Weight: Weight in (lb) to have BMI = 25: 159.3  GEN: WDWN, NAD, Non-toxic, A & O x 3 HEENT: Atraumatic, Normocephalic. Neck supple. No masses, No LAD. Ears and Nose: No external deformity. CV: RRR, No M/G/R. No JVD. No thrill. No extra heart sounds. PULM: CTA B, no wheezes, crackles, rhonchi. No retractions. No resp. distress. No  accessory muscle use. ABD: S, NT, ND, +BS. No rebound. No HSM. EXTR: No c/c/e NEURO Normal gait.  PSYCH: Normally interactive. Conversant. Not depressed or anxious appearing.  Calm demeanor.    Assessment and Plan: Bronchitis zpak Albuterol  Signed,  Phillips OdorJeffery Germaine Ripp, MD

## 2014-11-02 ENCOUNTER — Ambulatory Visit (INDEPENDENT_AMBULATORY_CARE_PROVIDER_SITE_OTHER): Payer: BC Managed Care – PPO | Admitting: Family Medicine

## 2014-11-02 VITALS — BP 122/74 | HR 96 | Temp 102.9°F | Resp 18 | Ht 67.0 in | Wt 215.0 lb

## 2014-11-02 DIAGNOSIS — R112 Nausea with vomiting, unspecified: Secondary | ICD-10-CM

## 2014-11-02 DIAGNOSIS — R1084 Generalized abdominal pain: Secondary | ICD-10-CM

## 2014-11-02 DIAGNOSIS — R509 Fever, unspecified: Secondary | ICD-10-CM

## 2014-11-02 DIAGNOSIS — R059 Cough, unspecified: Secondary | ICD-10-CM

## 2014-11-02 DIAGNOSIS — R05 Cough: Secondary | ICD-10-CM

## 2014-11-02 LAB — POCT CBC
Granulocyte percent: 77.9 %G (ref 37–80)
HCT, POC: 42.1 % (ref 37.7–47.9)
HEMOGLOBIN: 13.5 g/dL (ref 12.2–16.2)
Lymph, poc: 2.2 (ref 0.6–3.4)
MCH, POC: 28.7 pg (ref 27–31.2)
MCHC: 32.2 g/dL (ref 31.8–35.4)
MCV: 89 fL (ref 80–97)
MID (cbc): 0.7 (ref 0–0.9)
MPV: 7.3 fL (ref 0–99.8)
POC GRANULOCYTE: 10.2 — AB (ref 2–6.9)
POC LYMPH PERCENT: 17 %L (ref 10–50)
POC MID %: 5.1 % (ref 0–12)
Platelet Count, POC: 342 10*3/uL (ref 142–424)
RBC: 4.73 M/uL (ref 4.04–5.48)
RDW, POC: 13.7 %
WBC: 13.1 10*3/uL — AB (ref 4.6–10.2)

## 2014-11-02 LAB — POCT URINALYSIS DIPSTICK
GLUCOSE UA: NEGATIVE
Ketones, UA: >=160
Leukocytes, UA: NEGATIVE
NITRITE UA: NEGATIVE
PH UA: 5.5
Protein, UA: 30
Spec Grav, UA: 1.015
Urobilinogen, UA: 0.2

## 2014-11-02 LAB — POCT RAPID STREP A (OFFICE): Rapid Strep A Screen: NEGATIVE

## 2014-11-02 LAB — POCT INFLUENZA A/B
INFLUENZA A, POC: NEGATIVE
INFLUENZA B, POC: NEGATIVE

## 2014-11-02 LAB — POCT URINE PREGNANCY: Preg Test, Ur: NEGATIVE

## 2014-11-02 MED ORDER — LEVOFLOXACIN 500 MG PO TABS: 500.0000 mg | ORAL_TABLET | Freq: Every day | ORAL | Status: AC

## 2014-11-02 MED ORDER — ONDANSETRON 4 MG PO TBDP
8.0000 mg | ORAL_TABLET | Freq: Once | ORAL | Status: AC
  Administered 2014-11-02: 8 mg via ORAL

## 2014-11-02 MED ORDER — IBUPROFEN 600 MG PO TABS: 600.0000 mg | ORAL_TABLET | Freq: Once | ORAL | Status: AC

## 2014-11-02 MED ORDER — BENZONATATE 100 MG PO CAPS: 100.0000 mg | ORAL_CAPSULE | Freq: Three times a day (TID) | ORAL | Status: DC | PRN

## 2014-11-02 MED ORDER — ONDANSETRON 8 MG PO TBDP: 8.0000 mg | ORAL_TABLET | Freq: Three times a day (TID) | ORAL | Status: DC | PRN

## 2014-11-02 NOTE — Progress Notes (Signed)
MRN: [id number] DOB: 1981/02/24  Subjective:   Veronica Esparza is a 33 y.o. female presenting for 3 day history of generalized abdominal pain and fevers up to 102F. Abdominal pain is intermittent and crampy, feels like her period, patient states she cannot tell the difference. Admit constant nausea, vomiting x1, has had difficulty eating but is trying to stay hydrated, has also had loose stools. Associated symptoms include frontal headache rated 7/10, sinus pain especially over right eye, sore throat, difficulty swallowing, congestion, cough at times productive with clear-yellow sputum worse at night, myalgias. Has been taking Tylenol for fevers but otherwise has not tried anything for relief d/t nausea. Of note, patient has not ate any questionable foods recently and can only think of eating shrimp at [employer] this past Friday, which she admits seemed well cooked. Patient also saw gy[facility] 09/2014 [facility] [place], no significant findings. 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 Has a history of seasonal allergies, takes Flonase intermittently and Zyrtec daily. Also admits history of asthma, uses albuterol inhaler when she gets sick but not for this episode d/t nausea. Denies smoking, social alcohol use up to 2 drinks on the weekends. Denies any other aggravating or relieving factors, no other questions or concerns.   Asher MuirJamie has a current medication list which includes the following prescription(s): albuterol, alprazolam, cetirizine, fluoxetine, fluticasone, norethindrone-ethinyl estradiol-fe, and omeprazole.  She is allergic to buspar; codeine; and sulfa antibiotics.  Asher MuirJamie  has a past medical history of GERD (gastroesophageal reflux disease); Allergy; Anxiety; Depression; Chronic tension headaches; History of anorexia nervosa; and Webbing of toes. Also  has past surgical history that includes oral surgery; hand webbing excision; and Cosmetic surgery.  ROS As  in subjective.  Objective:   Vitals: BP 122/74 mmHg  Pulse 96  Temp(Src) 102.9 F (39.4 C) (Oral)  Resp 18  Ht 5\' 7"  (1.702 m)  Wt 215 lb (97.523 kg)  BMI 33.67 kg/m2  SpO2 97%  LMP 10/09/2014  Physical Exam  Constitutional: She is oriented to person, place, and time and well-developed, well-nourished, and in no distress.  HENT:  Right TM slightly injected, left TM flat, TM's intact bilaterally no effusions or erythema. Nasal turbinates inflamed, rhinorrhea clear-yellow. Maxillary sinus tenderness L>R, also frontal sinus tenderness. Postnasal drip present, tonsils edematous but without oropharyngeal exudates.  Eyes: Conjunctivae and EOM are normal. Pupils are equal, round, and reactive to light. Right eye exhibits no discharge. Left eye exhibits no discharge. No scleral icterus.  Neck: Normal range of motion.  Cardiovascular: Regular rhythm, normal heart sounds and intact distal pulses.  Tachycardia present.  Exam reveals no gallop and no friction rub.   No murmur heard. Pulmonary/Chest: Effort normal and breath sounds normal. No stridor. No respiratory distress. She has no wheezes. She has no rales. She exhibits no tenderness.  Abdominal: Soft. Bowel sounds are normal. She exhibits no distension and no mass. There is tenderness (left lower quadrant/suparpubic). There is no guarding.  Lymphadenopathy:    She has cervical adenopathy (anterior and bilateral).  Neurological: She is alert and oriented to person, place, and time.  Skin: Skin is warm and dry. No rash noted. She is not diaphoretic. No erythema.  Psychiatric: Mood and affect normal.   Results for orders placed or performed in visit on 11/02/14 (from the past 24 hour(s))  POCT CBC     Status: Abnormal   Collection Time: 11/02/14 12:43 PM  Result Value Ref Range   WBC  13.1 (A) 4.6 - 10.2 K/uL   Lymph, poc 2.2 0.6 - 3.4   POC LYMPH PERCENT 17.0 10 - 50 %L   MID (cbc) 0.7 0 - 0.9   POC MID % 5.1 0 - 12 %M   POC  Granulocyte 10.2 (A) 2 - 6.9   Granulocyte percent 77.9 37 - 80 %G   RBC 4.73 4.04 - 5.48 M/uL   Hemoglobin 13.5 12.2 - 16.2 g/dL   HCT, POC 78.242.1 95.637.7 - 47.9 %   MCV 89.0 80 - 97 fL   MCH, POC 28.7 27 - 31.2 pg   MCHC 32.2 31.8 - 35.4 g/dL   RDW, POC 21.313.7 %   Platelet Count, POC 342 142 - 424 K/uL   MPV 7.3 0 - 99.8 fL  POCT rapid strep A     Status: Normal   Collection Time: 11/02/14  1:12 PM  Result Value Ref Range   Rapid Strep A Screen Negative Negative  POCT Influenza A/B     Status: None   Collection Time: 11/02/14  1:12 PM  Result Value Ref Range   Influenza A, POC Negative    Influenza B, POC Negative   POCT urinalysis dipstick     Status: None   Collection Time: 11/02/14  1:49 PM  Result Value Ref Range   Color, UA dark yellow    Clarity, UA clear    Glucose, UA neg    Bilirubin, UA moderate    Ketones, UA >=160    Spec Grav, UA 1.015    Blood, UA moderate    pH, UA 5.5    Protein, UA 30    Urobilinogen, UA 0.2    Nitrite, UA neg    Leukocytes, UA Negative   POCT urine pregnancy     Status: None   Collection Time: 11/02/14  1:49 PM  Result Value Ref Range   Preg Test, Ur Negative    Assessment and Plan :   1. Fever, unspecified fever cause 2. Generalized abdominal pain 3. Non-intractable vomiting with nausea, vomiting of unspecified type - Elevated WBC, fever worrisome for possible gastroenteritis, atypical pneumonia, Rx Levaquin to cover for both. Consider diverticulitis, CT scan if no improvement - Offered IV fluids, CXR and CT scan which patient declined, will follow closely, [facility] - POCT UA dipstick - Rapid strep and flu swab negative, Strep culture pending - Comprehensive metabolic panel 11/03/2014 - Advised ibuprofen for fever - POCT urine pregnancy negative - Zofran for nausea and vomiting, advised adequate hydration and rest, supportive care, ibuprofen for fever  4. Cough - Tessalon for cough  Wallis BambergMario Rosalina Dingwall, PA-C [facility] [phone] 11/02/2014 2:02 PM

## 2014-11-02 NOTE — Progress Notes (Addendum)
I have also seen and examined this pt and agree with note by Mr. Urban GibsonMani, [place].  Performed pelvic exam as below.  Pelvic: normal, no vaginal lesions or discharge. Uterus normal, no CMT, no adnexal tendereness or masses She is just starting her menses and has light vaginal bleeding On repeat abd exam her abdominal tenderness is resolved.    She denies any sexual activity in over a year so PID risk is low.  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  She declines further testing at this point with any imaging but does plan to follow-up closely for a recheck tomorrow.  In the meantime will start on levaquin given her fever, cough and enteritis sx.  DDX includes enteritis, pneumonia, less likely diverticulitis.  If sx persist will reconsider CT  Meds ordered this encounter  Medications  . levofloxacin (LEVAQUIN) 500 MG tablet    Sig: Take 1 tablet (500 mg total) by mouth daily.    Dispense:  7 tablet    Refill:  0    Order Specific Question:  Supervising Provider    Answer:  SHAW, EVA N [[id number]]  . ondansetron (ZOFRAN-ODT) 8 MG disintegrating tablet    Sig: Take 1 tablet (8 mg total) by mouth every 8 (eight) hours as needed for nausea or vomiting.    Dispense:  20 tablet    Refill:  0    Order Specific Question:  Supervising Provider    Answer:  SHAW, EVA N [[id number]]  . benzonatate (TESSALON) 100 MG capsule    Sig: Take 1-2 capsules (100-200 mg total) by mouth 3 (three) times daily as needed for cough.    Dispense:  40 capsule    Refill:  0    Order Specific Question:  Supervising Provider    Answer:  SHAW, EVA N [[id number]]  . ondansetron (ZOFRAN-ODT) disintegrating tablet 8 mg    Sig:   . ibuprofen (ADVIL,MOTRIN) tablet 600 mg    Sig:      Results for orders placed or performed in visit on 11/02/14  POCT CBC  Result Value Ref Range   WBC 13.1 (A) 4.6 - 10.2 K/uL   Lymph, poc 2.2 0.6 - 3.4   POC LYMPH PERCENT 17.0 10 - 50 %L   MID (cbc) 0.7 0 -  0.9   POC MID % 5.1 0 - 12 %M   POC Granulocyte 10.2 (A) 2 - 6.9   Granulocyte percent 77.9 37 - 80 %G   RBC 4.73 4.04 - 5.48 M/uL   Hemoglobin 13.5 12.2 - 16.2 g/dL   HCT, POC 41.242.1 87.837.7 - 47.9 %   MCV 89.0 80 - 97 fL   MCH, POC 28.7 27 - 31.2 pg   MCHC 32.2 31.8 - 35.4 g/dL   RDW, POC 67.613.7 %   Platelet Count, POC 342 142 - 424 K/uL   MPV 7.3 0 - 99.8 fL  POCT urinalysis dipstick  Result Value Ref Range   Color, UA dark yellow    Clarity, UA clear    Glucose, UA neg    Bilirubin, UA moderate    Ketones, UA >=160    Spec Grav, UA 1.015    Blood, UA moderate    pH, UA 5.5    Protein, UA 30    Urobilinogen, UA 0.2    Nitrite, UA neg    Leukocytes, UA Negative   POCT rapid strep A  Result Value Ref Range  Rapid Strep A Screen Negative Negative  POCT urine pregnancy  Result Value Ref Range   Preg Test, Ur Negative   POCT Influenza A/B  Result Value Ref Range   Influenza A, POC Negative    Influenza B, POC Negative

## 2014-11-03 ENCOUNTER — Telehealth: Payer: Self-pay

## 2014-11-03 NOTE — Telephone Encounter (Addendum)
Spoke with patient regarding progress with Levaquin. States that she feels much better, has no fevers. I apologized to the patient for not being here d/t last minute schedule change. Advised patient to return to clinic if symptoms fail to resolve with antibiotic course.  Wallis BambergMario Mayrene Bastarache, PA-C [facility] [phone] 11/04/2014  8:06 AM

## 2014-11-03 NOTE — Telephone Encounter (Signed)
[facility] and would like a call back regarding her diagnosis. Please call [phone]

## 2014-11-04 ENCOUNTER — Encounter: Payer: Self-pay | Admitting: Urgent Care

## 2014-11-04 LAB — CULTURE, GROUP A STREP: ORGANISM ID, BACTERIA: NORMAL

## 2014-11-08 ENCOUNTER — Ambulatory Visit (INDEPENDENT_AMBULATORY_CARE_PROVIDER_SITE_OTHER): Payer: BC Managed Care – PPO | Admitting: Family Medicine

## 2014-11-08 VITALS — BP 128/88 | HR 88 | Temp 98.0°F | Resp 16 | Ht 67.0 in | Wt 215.0 lb

## 2014-11-08 DIAGNOSIS — Z862 Personal history of diseases of the blood and blood-forming organs and certain disorders involving the immune mechanism: Secondary | ICD-10-CM

## 2014-11-08 DIAGNOSIS — R8290 Unspecified abnormal findings in urine: Secondary | ICD-10-CM

## 2014-11-08 DIAGNOSIS — R739 Hyperglycemia, unspecified: Secondary | ICD-10-CM

## 2014-11-08 DIAGNOSIS — Z87898 Personal history of other specified conditions: Secondary | ICD-10-CM

## 2014-11-08 LAB — COMPREHENSIVE METABOLIC PANEL
ALK PHOS: 72 U/L (ref 39–117)
ALT: 64 U/L — AB (ref 0–35)
AST: 48 U/L — AB (ref 0–37)
Albumin: 3.8 g/dL (ref 3.5–5.2)
BUN: 7 mg/dL (ref 6–23)
CALCIUM: 9.1 mg/dL (ref 8.4–10.5)
CHLORIDE: 103 meq/L (ref 96–112)
CO2: 23 mEq/L (ref 19–32)
Creat: 0.65 mg/dL (ref 0.50–1.10)
Glucose, Bld: 91 mg/dL (ref 70–99)
POTASSIUM: 4.3 meq/L (ref 3.5–5.3)
Sodium: 138 mEq/L (ref 135–145)
TOTAL PROTEIN: 6.9 g/dL (ref 6.0–8.3)
Total Bilirubin: 0.3 mg/dL (ref 0.2–1.2)

## 2014-11-08 LAB — POCT URINALYSIS DIPSTICK
BILIRUBIN UA: NEGATIVE
GLUCOSE UA: NEGATIVE
KETONES UA: NEGATIVE
LEUKOCYTES UA: NEGATIVE
Nitrite, UA: NEGATIVE
PROTEIN UA: NEGATIVE
SPEC GRAV UA: 1.01
Urobilinogen, UA: 0.2
pH, UA: 6

## 2014-11-08 LAB — POCT CBC
Granulocyte percent: 58.1 %G (ref 37–80)
HCT, POC: 39.7 % (ref 37.7–47.9)
HEMOGLOBIN: 12.7 g/dL (ref 12.2–16.2)
Lymph, poc: 3 (ref 0.6–3.4)
MCH: 28.2 pg (ref 27–31.2)
MCHC: 32.1 g/dL (ref 31.8–35.4)
MCV: 87.9 fL (ref 80–97)
MID (cbc): 0.6 (ref 0–0.9)
MPV: 6.4 fL (ref 0–99.8)
POC GRANULOCYTE: 5 (ref 2–6.9)
POC LYMPH %: 35.2 % (ref 10–50)
POC MID %: 6.7 % (ref 0–12)
Platelet Count, POC: 430 10*3/uL — AB (ref 142–424)
RBC: 4.51 M/uL (ref 4.04–5.48)
RDW, POC: 13 %
WBC: 8.6 10*3/uL (ref 4.6–10.2)

## 2014-11-08 LAB — POCT GLYCOSYLATED HEMOGLOBIN (HGB A1C): Hemoglobin A1C: 6.4

## 2014-11-08 NOTE — Progress Notes (Signed)
MRN: [id number] DOB: 1981-09-09  Subjective:   Veronica Esparza is a 33 y.o. female presenting for follow up on fever, abdominal pain, abnormal urinalysis from 11/02/2014. Source was likely gastroenteritis, treated for infectious process. Due to physical exam findings and patient declining imaging, she was supposed to have close follow up. However, due to changes in PA-Jameela Michna's schedule, she was unable to complete her follow up last week despite showing up for her appointment. She presents today for this and states that she is significantly better. Her only residual symptom is an intermittent cough. She has completed Levaquin. No longer has fevers, abdominal pain, n/v. Of note, patient has a history of elevated a1c, family history of diabetes and at last visit, ketonuria. She has not had a diagnosis of diabetes herself. Denies any other aggravating or relieving factors, no other questions or concerns.  Veronica Esparza has a current medication list which includes the following prescription(s): albuterol, alprazolam, benzonatate, cetirizine, fluoxetine, fluticasone, levofloxacin, norethindrone-ethinyl estradiol-fe, omeprazole, and ondansetron, and the following Facility-Administered Medications: ibuprofen.  She is allergic to buspar; codeine; and sulfa antibiotics.  Veronica Esparza  has a past medical history of GERD (gastroesophageal reflux disease); Allergy; Anxiety; Depression; Chronic tension headaches; History of anorexia nervosa; and Webbing of toes. Also  has past surgical history that includes oral surgery; hand webbing excision; and Cosmetic surgery.  ROS As in subjective.  Objective:   Vitals: BP 128/88 mmHg  Pulse 88  Temp(Src) 98 F (36.7 C) (Oral)  Resp 16  Ht 5\' 7"  (1.702 m)  Wt 215 lb (97.523 kg)  BMI 33.67 kg/m2  SpO2 97%  LMP 10/09/2014  Physical Exam  Constitutional: She is oriented to person, place, and time and well-developed, well-nourished, and in no distress.  Cardiovascular: Normal  rate, regular rhythm, normal heart sounds and intact distal pulses.  Exam reveals no gallop and no friction rub.   No murmur heard. Pulmonary/Chest: Effort normal and breath sounds normal.  Abdominal: Soft. Bowel sounds are normal. She exhibits no distension and no mass. There is no tenderness. There is no rebound.  Musculoskeletal: Normal range of motion. She exhibits no edema or tenderness.  Neurological: She is alert and oriented to person, place, and time.  Skin: Skin is warm and dry. No rash noted. She is not diaphoretic. No erythema.  Psychiatric: Mood and affect normal.   Results for orders placed or performed in visit on 11/08/14 (from the past 24 hour(s))  POCT glycosylated hemoglobin (Hb A1C)     Status: None   Collection Time: 11/08/14  2:25 PM  Result Value Ref Range   Hemoglobin A1C 6.4   POCT urinalysis dipstick     Status: None   Collection Time: 11/08/14  2:25 PM  Result Value Ref Range   Color, UA yellow    Clarity, UA clear    Glucose, UA neg    Bilirubin, UA neg    Ketones, UA neg    Spec Grav, UA 1.010    Blood, UA moderate    pH, UA 6.0    Protein, UA neg    Urobilinogen, UA 0.2    Nitrite, UA neg    Leukocytes, UA Negative   POCT CBC     Status: Abnormal   Collection Time: 11/08/14  2:25 PM  Result Value Ref Range   WBC 8.6 4.6 - 10.2 K/uL   Lymph, poc 3.0 0.6 - 3.4   POC LYMPH PERCENT 35.2 10 - 50 %L   MID (cbc) 0.6 0 -  0.9   POC MID % 6.7 0 - 12 %M   POC Granulocyte 5.0 2 - 6.9   Granulocyte percent 58.1 37 - 80 %G   RBC 4.51 4.04 - 5.48 M/uL   Hemoglobin 12.7 12.2 - 16.2 g/dL   HCT, POC 13.039.7 86.537.7 - 47.9 %   MCV 87.9 80 - 97 fL   MCH, POC 28.2 27 - 31.2 pg   MCHC 32.1 31.8 - 35.4 g/dL   RDW, POC 78.413.0 %   Platelet Count, POC 430 (A) 142 - 424 K/uL   MPV 6.4 0 - 99.8 fL   Assessment and Plan :   1. Abnormal urinalysis 2. History of leukocytosis 3. History of fever - CBC, UA and physical exam reassuring, patient is significantly improved -  hematuria on dipstick likely d/t patient being on tail end of her menstrual cycle - Cmet pending - f/u as needed  4. Hyperglycemia - A1c still elevated, Cmet pending - will monitor, advised dietary modification, exercise, f/u in 6 months - 1 year  Wallis BambergMario Ramiz Turpin, PA-C [facility] [phone] 11/08/2014 2:28 PM

## 2014-11-09 ENCOUNTER — Encounter: Payer: Self-pay | Admitting: Urgent Care

## 2015-01-03 ENCOUNTER — Ambulatory Visit (INDEPENDENT_AMBULATORY_CARE_PROVIDER_SITE_OTHER): Payer: BC Managed Care – PPO | Admitting: Internal Medicine

## 2015-01-03 VITALS — BP 132/86 | HR 95 | Temp 98.0°F | Resp 16 | Ht 66.5 in | Wt 219.4 lb

## 2015-01-03 DIAGNOSIS — H9202 Otalgia, left ear: Secondary | ICD-10-CM

## 2015-01-03 DIAGNOSIS — J029 Acute pharyngitis, unspecified: Secondary | ICD-10-CM

## 2015-01-03 LAB — POCT RAPID STREP A (OFFICE): RAPID STREP A SCREEN: NEGATIVE

## 2015-01-03 MED ORDER — AZITHROMYCIN 500 MG PO TABS: 500.0000 mg | ORAL_TABLET | Freq: Every day | ORAL | Status: AC

## 2015-01-03 MED ORDER — IBUPROFEN 600 MG PO TABS: 600.0000 mg | ORAL_TABLET | Freq: Three times a day (TID) | ORAL | Status: AC | PRN

## 2015-01-03 NOTE — Progress Notes (Signed)
   Subjective:    Patient ID: Veronica HeaterJamie L Harbison, female    DOB: 1981-01-23, 34 y.o.   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 Onset 4 days ago Assoc with chills and headache, tired pain with swallowing but able  2. Left ear pain 5/10 in severity worse with swallowing  No ear drainage No cough no shortness of breath 3. Myalgias fatigue aching    Review of Systems  Constitutional: Positive for chills, activity change, appetite change and fatigue. Negative for fever.  HENT: Positive for ear pain, rhinorrhea, sore throat and trouble swallowing. Negative for mouth sores, nosebleeds and sinus pressure.   Eyes: Negative.   Respiratory: Negative.   Cardiovascular: Negative.   Gastrointestinal: Negative.   Endocrine: Negative.   Genitourinary: Negative.   Musculoskeletal: Negative.   Skin: Negative.   Allergic/Immunologic: Negative.   Neurological: Negative.   Hematological: Negative.   Psychiatric/Behavioral: Negative.   All other systems reviewed and are negative.      Objective:   Physical Exam  Constitutional: She is oriented to person, place, and time. She appears well-developed and well-nourished.  HENT:  Head: Normocephalic and atraumatic.  Nose: Nose normal.  Mouth/Throat: No oropharyngeal exudate.  Pharyngeal erythema bilaterally no exudate no abcess  Eyes: Pupils are equal, round, and reactive to light.  Neck: Normal range of motion. Neck supple. No JVD present.  Tender swollen anterior cerci val lymphadenopathy  Cardiovascular: Normal rate, regular rhythm, normal heart sounds and intact distal pulses.   Pulmonary/Chest: Effort normal and breath sounds normal. No stridor. She has no wheezes. She has no rales.  Abdominal: Soft. Bowel sounds are normal.  Musculoskeletal:  Normal range of motion.  Lymphadenopathy:    She has cervical adenopathy.  Neurological: She is alert and oriented to person, place, and time.  Skin: Skin is warm and dry.  Psychiatric: She has a normal mood and affect. Her behavior is normal. Judgment and thought content normal.     Results for orders placed or performed in visit on 01/03/15  POCT rapid strep A  Result Value Ref Range   Rapid Strep A Screen Negative Negative       Assessment & Plan:  1. Sore throat Throat is erythematous but with no exudate, no abscess, able to swallow secretions. Has anterior cervical lymphadenopathy. Will obtain rapid strep and treat with antibiotics and analgesics.  Strep is negative. 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 Left ear ache Otalgia is likely secondary to the pharyngeal pain. No evidence of infection in the ear. 3.myalgias and aches secondary to infection will treat with analgeisics

## 2015-01-03 NOTE — Patient Instructions (Signed)
Take the meds as directed. Gargle with warm salt water 3 times daily. Rest . Increase fluids. follow up as directed.

## 2015-02-17 ENCOUNTER — Other Ambulatory Visit: Payer: Self-pay | Admitting: Physician Assistant

## 2015-03-18 ENCOUNTER — Other Ambulatory Visit: Payer: Self-pay | Admitting: Physician Assistant

## 2016-03-06 ENCOUNTER — Other Ambulatory Visit: Payer: Self-pay

## 2016-03-06 MED ORDER — FLUTICASONE PROPIONATE 50 MCG/ACT NA SUSP: 2.0000 | Freq: Every day | NASAL | Status: AC

## 2018-10-21 ENCOUNTER — Encounter (HOSPITAL_COMMUNITY): Payer: Self-pay | Admitting: Obstetrics and Gynecology

## 2021-12-13 DIAGNOSIS — J209 Acute bronchitis, unspecified: Secondary | ICD-10-CM | POA: Diagnosis not present

## 2021-12-13 DIAGNOSIS — H66002 Acute suppurative otitis media without spontaneous rupture of ear drum, left ear: Secondary | ICD-10-CM | POA: Diagnosis not present

## 2021-12-13 DIAGNOSIS — R0602 Shortness of breath: Secondary | ICD-10-CM | POA: Diagnosis not present

## 2022-03-16 DIAGNOSIS — J0140 Acute pansinusitis, unspecified: Secondary | ICD-10-CM | POA: Diagnosis not present

## 2022-08-13 DIAGNOSIS — M542 Cervicalgia: Secondary | ICD-10-CM | POA: Diagnosis not present

## 2023-01-10 DIAGNOSIS — Z8616 Personal history of COVID-19: Secondary | ICD-10-CM | POA: Diagnosis not present

## 2023-01-10 DIAGNOSIS — R051 Acute cough: Secondary | ICD-10-CM | POA: Diagnosis not present

## 2023-01-10 DIAGNOSIS — J069 Acute upper respiratory infection, unspecified: Secondary | ICD-10-CM | POA: Diagnosis not present

## 2023-01-10 DIAGNOSIS — J029 Acute pharyngitis, unspecified: Secondary | ICD-10-CM | POA: Diagnosis not present

## 2023-01-14 DIAGNOSIS — J0100 Acute maxillary sinusitis, unspecified: Secondary | ICD-10-CM | POA: Diagnosis not present

## 2023-02-19 DIAGNOSIS — Z01419 Encounter for gynecological examination (general) (routine) without abnormal findings: Secondary | ICD-10-CM | POA: Diagnosis not present

## 2023-02-19 DIAGNOSIS — Z1329 Encounter for screening for other suspected endocrine disorder: Secondary | ICD-10-CM | POA: Diagnosis not present

## 2023-02-19 DIAGNOSIS — Z1231 Encounter for screening mammogram for malignant neoplasm of breast: Secondary | ICD-10-CM | POA: Diagnosis not present

## 2023-02-19 DIAGNOSIS — Z0000 Encounter for general adult medical examination without abnormal findings: Secondary | ICD-10-CM | POA: Diagnosis not present

## 2023-02-19 DIAGNOSIS — Z13220 Encounter for screening for lipoid disorders: Secondary | ICD-10-CM | POA: Diagnosis not present

## 2023-02-19 DIAGNOSIS — Z130 Encounter for screening for diseases of the blood and blood-forming organs and certain disorders involving the immune mechanism: Secondary | ICD-10-CM | POA: Diagnosis not present

## 2023-02-19 DIAGNOSIS — Z131 Encounter for screening for diabetes mellitus: Secondary | ICD-10-CM | POA: Diagnosis not present

## 2023-03-01 DIAGNOSIS — N6322 Unspecified lump in the left breast, upper inner quadrant: Secondary | ICD-10-CM | POA: Diagnosis not present

## 2023-03-28 DIAGNOSIS — Z3202 Encounter for pregnancy test, result negative: Secondary | ICD-10-CM | POA: Diagnosis not present

## 2023-03-28 DIAGNOSIS — Z30433 Encounter for removal and reinsertion of intrauterine contraceptive device: Secondary | ICD-10-CM | POA: Diagnosis not present

## 2023-03-28 DIAGNOSIS — Z30430 Encounter for insertion of intrauterine contraceptive device: Secondary | ICD-10-CM | POA: Diagnosis not present

## 2023-04-01 DIAGNOSIS — E1165 Type 2 diabetes mellitus with hyperglycemia: Secondary | ICD-10-CM | POA: Diagnosis not present

## 2023-04-02 DIAGNOSIS — B9789 Other viral agents as the cause of diseases classified elsewhere: Secondary | ICD-10-CM | POA: Diagnosis not present

## 2023-04-02 DIAGNOSIS — J329 Chronic sinusitis, unspecified: Secondary | ICD-10-CM | POA: Diagnosis not present

## 2023-04-03 DIAGNOSIS — J019 Acute sinusitis, unspecified: Secondary | ICD-10-CM | POA: Diagnosis not present

## 2023-05-21 DIAGNOSIS — H1045 Other chronic allergic conjunctivitis: Secondary | ICD-10-CM | POA: Diagnosis not present

## 2023-05-21 DIAGNOSIS — E119 Type 2 diabetes mellitus without complications: Secondary | ICD-10-CM | POA: Diagnosis not present

## 2023-07-02 DIAGNOSIS — E1165 Type 2 diabetes mellitus with hyperglycemia: Secondary | ICD-10-CM | POA: Diagnosis not present

## 2023-07-30 DIAGNOSIS — E1165 Type 2 diabetes mellitus with hyperglycemia: Secondary | ICD-10-CM | POA: Diagnosis not present

## 2023-08-27 DIAGNOSIS — J019 Acute sinusitis, unspecified: Secondary | ICD-10-CM | POA: Diagnosis not present

## 2023-09-06 DIAGNOSIS — N6322 Unspecified lump in the left breast, upper inner quadrant: Secondary | ICD-10-CM | POA: Diagnosis not present

## 2023-11-26 DIAGNOSIS — E1165 Type 2 diabetes mellitus with hyperglycemia: Secondary | ICD-10-CM | POA: Diagnosis not present

## 2023-12-03 DIAGNOSIS — E785 Hyperlipidemia, unspecified: Secondary | ICD-10-CM | POA: Diagnosis not present

## 2023-12-03 DIAGNOSIS — E1165 Type 2 diabetes mellitus with hyperglycemia: Secondary | ICD-10-CM | POA: Diagnosis not present

## 2023-12-31 DIAGNOSIS — J101 Influenza due to other identified influenza virus with other respiratory manifestations: Secondary | ICD-10-CM | POA: Diagnosis not present

## 2023-12-31 DIAGNOSIS — Z20822 Contact with and (suspected) exposure to covid-19: Secondary | ICD-10-CM | POA: Diagnosis not present

## 2023-12-31 DIAGNOSIS — R059 Cough, unspecified: Secondary | ICD-10-CM | POA: Diagnosis not present

## 2024-02-20 DIAGNOSIS — J019 Acute sinusitis, unspecified: Secondary | ICD-10-CM | POA: Diagnosis not present

## 2024-02-20 DIAGNOSIS — J209 Acute bronchitis, unspecified: Secondary | ICD-10-CM | POA: Diagnosis not present

## 2024-02-20 DIAGNOSIS — J029 Acute pharyngitis, unspecified: Secondary | ICD-10-CM | POA: Diagnosis not present

## 2024-03-04 DIAGNOSIS — J029 Acute pharyngitis, unspecified: Secondary | ICD-10-CM | POA: Diagnosis not present

## 2024-03-04 DIAGNOSIS — Z20822 Contact with and (suspected) exposure to covid-19: Secondary | ICD-10-CM | POA: Diagnosis not present

## 2024-03-04 DIAGNOSIS — R059 Cough, unspecified: Secondary | ICD-10-CM | POA: Diagnosis not present

## 2024-03-27 DIAGNOSIS — N6322 Unspecified lump in the left breast, upper inner quadrant: Secondary | ICD-10-CM | POA: Diagnosis not present

## 2024-05-25 DIAGNOSIS — E119 Type 2 diabetes mellitus without complications: Secondary | ICD-10-CM | POA: Diagnosis not present

## 2024-05-25 DIAGNOSIS — H1045 Other chronic allergic conjunctivitis: Secondary | ICD-10-CM | POA: Diagnosis not present

## 2024-05-25 DIAGNOSIS — H5213 Myopia, bilateral: Secondary | ICD-10-CM | POA: Diagnosis not present

## 2024-05-26 DIAGNOSIS — Z01419 Encounter for gynecological examination (general) (routine) without abnormal findings: Secondary | ICD-10-CM | POA: Diagnosis not present

## 2024-05-26 DIAGNOSIS — Z124 Encounter for screening for malignant neoplasm of cervix: Secondary | ICD-10-CM | POA: Diagnosis not present

## 2024-06-01 DIAGNOSIS — E1165 Type 2 diabetes mellitus with hyperglycemia: Secondary | ICD-10-CM | POA: Diagnosis not present

## 2024-06-08 DIAGNOSIS — E785 Hyperlipidemia, unspecified: Secondary | ICD-10-CM | POA: Diagnosis not present

## 2024-06-08 DIAGNOSIS — E1165 Type 2 diabetes mellitus with hyperglycemia: Secondary | ICD-10-CM | POA: Diagnosis not present

## 2024-09-04 DIAGNOSIS — F411 Generalized anxiety disorder: Secondary | ICD-10-CM | POA: Diagnosis not present

## 2024-09-17 DIAGNOSIS — F4323 Adjustment disorder with mixed anxiety and depressed mood: Secondary | ICD-10-CM | POA: Diagnosis not present

## 2024-10-02 DIAGNOSIS — F4323 Adjustment disorder with mixed anxiety and depressed mood: Secondary | ICD-10-CM | POA: Diagnosis not present

## 2024-10-16 DIAGNOSIS — F4323 Adjustment disorder with mixed anxiety and depressed mood: Secondary | ICD-10-CM | POA: Diagnosis not present

## 2024-10-28 DIAGNOSIS — F4323 Adjustment disorder with mixed anxiety and depressed mood: Secondary | ICD-10-CM | POA: Diagnosis not present
